# Patient Record
Sex: Female | Born: 1970 | Race: White | Hispanic: No | Marital: Single | Smoking: Current every day smoker
Health system: Southern US, Community
[De-identification: ages and names within clinical notes are randomized; demographics above are authoritative.]

## PROBLEM LIST (undated history)

## (undated) DIAGNOSIS — J449 Chronic obstructive pulmonary disease, unspecified: Secondary | ICD-10-CM

## (undated) DIAGNOSIS — J45909 Unspecified asthma, uncomplicated: Secondary | ICD-10-CM

## (undated) DIAGNOSIS — I10 Essential (primary) hypertension: Secondary | ICD-10-CM

## (undated) HISTORY — PX: ASD REPAIR: SHX258

## (undated) HISTORY — PX: OVARIAN CYST REMOVAL: SHX89

## (undated) HISTORY — PX: CHOLECYSTECTOMY: SHX55

---

## 2005-04-13 ENCOUNTER — Emergency Department: Payer: Self-pay | Admitting: Emergency Medicine

## 2005-12-08 ENCOUNTER — Emergency Department: Payer: Self-pay | Admitting: Emergency Medicine

## 2006-08-03 ENCOUNTER — Emergency Department: Payer: Self-pay | Admitting: General Practice

## 2007-02-19 ENCOUNTER — Emergency Department: Payer: Self-pay | Admitting: Internal Medicine

## 2008-03-13 ENCOUNTER — Emergency Department: Payer: Self-pay | Admitting: Emergency Medicine

## 2009-04-05 ENCOUNTER — Emergency Department: Payer: Self-pay | Admitting: Emergency Medicine

## 2009-07-11 ENCOUNTER — Emergency Department: Payer: Self-pay | Admitting: Emergency Medicine

## 2010-08-03 ENCOUNTER — Emergency Department: Payer: Self-pay | Admitting: Internal Medicine

## 2011-05-02 ENCOUNTER — Emergency Department: Payer: Self-pay | Admitting: Emergency Medicine

## 2012-08-02 ENCOUNTER — Emergency Department: Payer: Self-pay | Admitting: Unknown Physician Specialty

## 2013-04-10 ENCOUNTER — Emergency Department: Payer: Self-pay | Admitting: Emergency Medicine

## 2013-05-15 ENCOUNTER — Emergency Department: Payer: Self-pay | Admitting: Emergency Medicine

## 2014-05-29 ENCOUNTER — Emergency Department: Payer: Self-pay | Admitting: Student

## 2015-09-13 ENCOUNTER — Emergency Department
Admission: EM | Admit: 2015-09-13 | Discharge: 2015-09-13 | Disposition: A | Discharge status: Home / Self Care | Payer: Self-pay | Attending: Emergency Medicine | Admitting: Emergency Medicine

## 2015-09-13 ENCOUNTER — Encounter: Payer: Self-pay | Admitting: Emergency Medicine

## 2015-09-13 DIAGNOSIS — K529 Noninfective gastroenteritis and colitis, unspecified: Secondary | ICD-10-CM | POA: Insufficient documentation

## 2015-09-13 DIAGNOSIS — F17210 Nicotine dependence, cigarettes, uncomplicated: Secondary | ICD-10-CM | POA: Insufficient documentation

## 2015-09-13 DIAGNOSIS — N3001 Acute cystitis with hematuria: Secondary | ICD-10-CM | POA: Insufficient documentation

## 2015-09-13 HISTORY — DX: Chronic obstructive pulmonary disease, unspecified: J44.9

## 2015-09-13 HISTORY — DX: Unspecified asthma, uncomplicated: J45.909

## 2015-09-13 LAB — CBC WITH DIFFERENTIAL/PLATELET
BASOS PCT: 0 %
Basophils Absolute: 0 10*3/uL (ref 0–0.1)
EOS ABS: 0.1 10*3/uL (ref 0–0.7)
Eosinophils Relative: 0 %
HCT: 49.9 % — ABNORMAL HIGH (ref 35.0–47.0)
HEMOGLOBIN: 17.1 g/dL — AB (ref 12.0–16.0)
LYMPHS ABS: 0.4 10*3/uL — AB (ref 1.0–3.6)
Lymphocytes Relative: 3 %
MCH: 29.9 pg (ref 26.0–34.0)
MCHC: 34.3 g/dL (ref 32.0–36.0)
MCV: 87.3 fL (ref 80.0–100.0)
Monocytes Absolute: 0.5 10*3/uL (ref 0.2–0.9)
Monocytes Relative: 3 %
NEUTROS PCT: 94 %
Neutro Abs: 15 10*3/uL — ABNORMAL HIGH (ref 1.4–6.5)
Platelets: 265 10*3/uL (ref 150–440)
RBC: 5.71 MIL/uL — AB (ref 3.80–5.20)
RDW: 13.2 % (ref 11.5–14.5)
WBC: 16 10*3/uL — AB (ref 3.6–11.0)

## 2015-09-13 LAB — URINALYSIS COMPLETE WITH MICROSCOPIC (ARMC ONLY)
BILIRUBIN URINE: NEGATIVE
Glucose, UA: NEGATIVE mg/dL
NITRITE: NEGATIVE
PH: 6 (ref 5.0–8.0)
Protein, ur: 100 mg/dL — AB
Specific Gravity, Urine: 1.027 (ref 1.005–1.030)

## 2015-09-13 LAB — BASIC METABOLIC PANEL
Anion gap: 12 (ref 5–15)
BUN: 17 mg/dL (ref 6–20)
CHLORIDE: 104 mmol/L (ref 101–111)
CO2: 22 mmol/L (ref 22–32)
Calcium: 9.3 mg/dL (ref 8.9–10.3)
Creatinine, Ser: 1.2 mg/dL — ABNORMAL HIGH (ref 0.44–1.00)
GFR calc non Af Amer: 55 mL/min — ABNORMAL LOW (ref >60)
Glucose, Bld: 173 mg/dL — ABNORMAL HIGH (ref 65–99)
POTASSIUM: 3.4 mmol/L — AB (ref 3.5–5.1)
SODIUM: 138 mmol/L (ref 135–145)

## 2015-09-13 MED ORDER — SODIUM CHLORIDE 0.9 % IV BOLUS (SEPSIS)
1000.0000 mL | Freq: Once | INTRAVENOUS | Status: AC
  Administered 2015-09-13: 1000 mL via INTRAVENOUS

## 2015-09-13 MED ORDER — ONDANSETRON HCL 4 MG/2ML IJ SOLN
4.0000 mg | Freq: Once | INTRAMUSCULAR | Status: AC
  Administered 2015-09-13: 4 mg via INTRAVENOUS
  Filled 2015-09-13: qty 2

## 2015-09-13 MED ORDER — PROMETHAZINE HCL 25 MG PO TABS: 25.0000 mg | ORAL_TABLET | Freq: Four times a day (QID) | ORAL | Status: DC | PRN

## 2015-09-13 MED ORDER — FLUCONAZOLE 150 MG PO TABS: 150.0000 mg | ORAL_TABLET | Freq: Every day | ORAL | Status: DC

## 2015-09-13 MED ORDER — SULFAMETHOXAZOLE-TRIMETHOPRIM 800-160 MG PO TABS: 1.0000 | ORAL_TABLET | Freq: Two times a day (BID) | ORAL | Status: DC

## 2015-09-13 NOTE — ED Notes (Signed)
N,V,D that began this am.

## 2015-09-13 NOTE — ED Notes (Signed)
Patient c/o N/V/D since 5:30 this morning, states that she feels weak and lightheaded. Also c/o cramping in her legs.

## 2015-09-13 NOTE — ED Notes (Signed)
Patient states that she lasted vomited 1 hour ago, she has been able to keep gatorade down since that time. Patient denies any sick contacts.

## 2015-09-13 NOTE — ED Notes (Signed)
Pt states she has been able to keep Gatorade down.

## 2015-09-13 NOTE — ED Provider Notes (Signed)
[facility] Emergency Department Provider Note  ____________________________________________  Time seen: Approximately 10:46 AM  I have reviewed the triage vital signs and the nursing notes.   HISTORY  Chief Complaint Emesis and Diarrhea    HPI Lisa Sexton is a 45 y.o. female presents for evaluation of nausea vomiting diarrhea since 5:30 this morning which is approximately 5 hours ago. Patient states she is unable to keep anything down. Denies any fever chills denies any sick contacts. Her past medical history is significant only for asthma and COPD. She describes her symptoms as a 7/10 with no relief with clear liquids and Gatorade at this time. Worse with trying to eat. Or sit up.   Past Medical History  Diagnosis Date  . COPD (chronic obstructive pulmonary disease) (HCC)   . Asthma     There are no active problems to display for this patient.   History reviewed. No pertinent past surgical history.  Current Outpatient Rx  Name  Route  Sig  Dispense  Refill  . promethazine (PHENERGAN) 25 MG tablet   Oral   Take 1 tablet (25 mg total) by mouth every 6 (six) hours as needed for nausea or vomiting.   12 tablet   0   . sulfamethoxazole-trimethoprim (BACTRIM DS,SEPTRA DS) 800-160 MG tablet   Oral   Take 1 tablet by mouth 2 (two) times daily.   14 tablet   0     Allergies Review of patient's allergies indicates no known allergies.  No family history on file.  Social History Social History  Substance Use Topics  . Smoking status: Current Every Day Smoker -- 1.00 packs/day    Types: Cigarettes  . Smokeless tobacco: None  . Alcohol Use: Yes     Comment: rarely    Review of Systems Constitutional: No fever/chills Eyes: No visual changes. ENT: No sore throat. Cardiovascular: Denies chest pain. Respiratory: Denies shortness of breath. Gastrointestinal: Mild abdominal cramping with nausea, vomiting, diarrhea.. Genitourinary:  Negative for dysuria. Musculoskeletal: Negative for back pain. Skin: Negative for rash. Neurological: Negative for headaches, focal weakness or numbness.  10-point ROS otherwise negative.  ____________________________________________   PHYSICAL EXAM:  VITAL SIGNS: ED Triage Vitals  Enc Vitals Group     BP 09/13/15 1025 111/68 mmHg     Pulse Rate 09/13/15 1025 93     Resp 09/13/15 1025 18     Temp 09/13/15 1025 98.2 F (36.8 C)     Temp Source 09/13/15 1025 Oral     SpO2 09/13/15 1025 98 %     Weight 09/13/15 1025 210 lb (95.255 kg)     Height 09/13/15 1025  (1.549 m)     Head Cir --      Peak Flow --      Pain Score 09/13/15 1026 7     Pain Loc --      Pain Edu? --      Excl. in GC? --     Constitutional: Alert and oriented. Sick appearing lying on the side holding her stomach Head: Atraumatic. Nose: No congestion/rhinnorhea. Mouth/Throat: Mucous membranes are moist.  Oropharynx non-erythematous. Neck: No stridor.   Cardiovascular: Normal rate, regular rhythm. Grossly normal heart sounds.  Good peripheral circulation. Respiratory: Normal respiratory effort.  No retractions. Lungs CTAB. Gastrointestinal: Soft and tender. No distention. No abdominal bruits. No CVA tenderness. Musculoskeletal: No lower extremity tenderness nor edema.  No joint effusions. Neurologic:  Normal speech and language. No gross focal neurologic deficits are  appreciated. No gait instability. Skin:  Skin is warm, dry and intact. No rash noted. Psychiatric: Mood and affect are normal. Speech and behavior are normal.  ____________________________________________   LABS (all labs ordered are listed, but only abnormal results are displayed)  Labs Reviewed  CBC WITH DIFFERENTIAL/PLATELET - Abnormal; Notable for the following:    WBC 16.0 (*)    RBC 5.71 (*)    Hemoglobin 17.1 (*)    HCT 49.9 (*)    Neutro Abs 15.0 (*)    Lymphs Abs 0.4 (*)    All other components within normal limits   BASIC METABOLIC PANEL - Abnormal; Notable for the following:    Potassium 3.4 (*)    Glucose, Bld 173 (*)    Creatinine, Ser 1.20 (*)    GFR calc non Af Amer 55 (*)    All other components within normal limits  URINALYSIS COMPLETEWITH MICROSCOPIC ([facility] ONLY) - Abnormal; Notable for the following:    Color, Urine AMBER (*)    APPearance HAZY (*)    Ketones, ur TRACE (*)    Hgb urine dipstick 1+ (*)    Protein, ur 100 (*)    Leukocytes, UA TRACE (*)    Bacteria, UA RARE (*)    Squamous Epithelial / LPF 0-5 (*)    All other components within normal limits     PROCEDURES  Procedure(s) performed: None  Critical Care performed: No  ____________________________________________   INITIAL IMPRESSION / ASSESSMENT AND PLAN / ED COURSE  Pertinent labs & imaging results that were available during my care of the patient were reviewed by me and considered in my medical decision making (see chart for details).  Acute gastroenteritis. Plan is to replace some fluids and draw some labs to make sure the patient is hemodynamically stable prior to discharge and given Zofran 4 mg IV. Patient was given 2 L of IV fluids feeling significantly better at discharge. Still slightly nauseated.  Acute urinary tract infection. Patient was slightly dehydrated provided all lab results to the patient and her husband Rx given for Phenergan 25 mg every 6 hours as needed for nausea and vomiting clear liquid diet for the next 12-24 hours started patient on Bactrim DS twice a day for 7 days work excuse 2 days given. Patient to follow up with PCP or return to the ER with any worsening symptomology. ____________________________________________   FINAL CLINICAL IMPRESSION(S) / ED DIAGNOSES  Final diagnoses:  Acute cystitis with hematuria  Gastroenteritis, acute     This chart was dictated using voice recognition software/Dragon. Despite best efforts to proofread, errors can occur which can change the meaning.  Any change was purely unintentional.   Evangeline Dakinharles M Beers, PA-C 09/13/15 1315  Governor Rooksebecca Lord, MD 09/13/15 1350

## 2016-08-03 ENCOUNTER — Emergency Department
Admission: EM | Admit: 2016-08-03 | Discharge: 2016-08-03 | Disposition: A | Discharge status: Home / Self Care | Payer: Self-pay | Attending: Emergency Medicine | Admitting: Emergency Medicine

## 2016-08-03 DIAGNOSIS — J449 Chronic obstructive pulmonary disease, unspecified: Secondary | ICD-10-CM | POA: Insufficient documentation

## 2016-08-03 DIAGNOSIS — J45909 Unspecified asthma, uncomplicated: Secondary | ICD-10-CM | POA: Insufficient documentation

## 2016-08-03 DIAGNOSIS — F17210 Nicotine dependence, cigarettes, uncomplicated: Secondary | ICD-10-CM | POA: Insufficient documentation

## 2016-08-03 DIAGNOSIS — K120 Recurrent oral aphthae: Secondary | ICD-10-CM | POA: Insufficient documentation

## 2016-08-03 MED ORDER — TRIAMCINOLONE ACETONIDE 0.1 % MT PSTE: 1.0000 "application " | PASTE | Freq: Two times a day (BID) | OROMUCOSAL | 0 refills | Status: DC

## 2016-08-03 NOTE — ED Triage Notes (Signed)
Pt went to donate plasma on Jan 9th and they told her that she had white blemishes under tongue and that she needed to have them checked out

## 2016-08-03 NOTE — ED Provider Notes (Signed)
[facility] Emergency Department Provider Note  ____________________________________________   First MD Initiated Contact with Patient 08/03/16 1420     (approximate)  I have reviewed the triage vital signs and the nursing notes.   HISTORY  Chief Complaint Mouth Lesions   HPI Lisa Sexton is a 46 y.o. female is here with complaint of right limitations on her tongue that was seen at the plasma donation center on January 9. Patient states she went there today and a plasma and was turned down because of this. She is now here to have the areas checked out. She states she never saw the right areas that the provider there pointed out to her. She states the only time she is having now is a ulcerative lesion on the inner lower lip. She is also here to have papers filled out so that she can now donate plasma.   Past Medical History:  Diagnosis Date  . Asthma   . COPD (chronic obstructive pulmonary disease) (HCC)     There are no active problems to display for this patient.   History reviewed. No pertinent surgical history.  Prior to Admission medications   Medication Sig Start Date End Date Taking? Authorizing Provider  fluconazole (DIFLUCAN) 150 MG tablet Take 1 tablet (150 mg total) by mouth daily. 09/13/15   Evangeline Dakin, PA-C  promethazine (PHENERGAN) 25 MG tablet Take 1 tablet (25 mg total) by mouth every 6 (six) hours as needed for nausea or vomiting. 09/13/15   Charmayne Sheer Beers, PA-C  sulfamethoxazole-trimethoprim (BACTRIM DS,SEPTRA DS) 800-160 MG tablet Take 1 tablet by mouth 2 (two) times daily. 09/13/15   Charmayne Sheer Beers, PA-C  triamcinolone (KENALOG) 0.1 % paste Use as directed 1 application in the mouth or throat 2 (two) times daily. 08/03/16   Tommi Rumps, PA-C    Allergies Patient has no known allergies.  No family history on file.  Social History Social History  Substance Use Topics  . Smoking status: Current Every Day Smoker   Packs/day: 1.00    Types: Cigarettes  . Smokeless tobacco: Never Used  . Alcohol use Yes     Comment: rarely    Review of Systems Constitutional: No fever/chills ENT: No sore throat.Positive ulcerative lesion to the mouth. Cardiovascular: Denies chest pain. Respiratory: Denies shortness of breath. Gastrointestinal:  No nausea, no vomiting. Musculoskeletal: Negative for back pain. Skin: As above. Neurological: Negative for headaches, focal weakness or numbness.  10-point ROS otherwise negative.  ____________________________________________   PHYSICAL EXAM:  VITAL SIGNS: ED Triage Vitals [08/03/16 1410]  Enc Vitals Group     BP (!) 151/84     Pulse Rate 74     Resp 16     Temp 97.8 F (36.6 C)     Temp Source Oral     SpO2 99 %     Weight 206 lb (93.4 kg)     Height 5\' 1"  (1.549 m)     Head Circumference      Peak Flow      Pain Score 0     Pain Loc      Pain Edu?      Excl. in GC?     Constitutional: Alert and oriented. Well appearing and in no acute distress. Eyes: Conjunctivae are normal. PERRL. EOMI. Head: Atraumatic. Nose: No congestion/rhinnorhea. Mouth/Throat: Mucous membranes are moist.  Oropharynx non-erythematous. Superficial 1 cm ulcerative lesion on the inner lower right lip. No drainage noted. Neck: No stridor.  Hematological/Lymphatic/Immunilogical: No cervical lymphadenopathy. Cardiovascular: Normal rate, regular rhythm. Grossly normal heart sounds.  Good peripheral circulation. Respiratory: Normal respiratory effort.  No retractions. Lungs CTAB. Musculoskeletal: His upper and lower extremities no difficulty. Normal gait was noted. Neurologic:  Normal speech and language. No gross focal neurologic deficits are appreciated. No gait instability. Skin:  Skin is warm, dry and intact. No rash noted. Psychiatric: Mood and affect are normal. Speech and behavior are normal.  ____________________________________________   LABS (all labs ordered are  listed, but only abnormal results are displayed)  Labs Reviewed - No data to display  PROCEDURES  Procedure(s) performed: None  Procedures  Critical Care performed: No  ____________________________________________   INITIAL IMPRESSION / ASSESSMENT AND PLAN / ED COURSE  Pertinent labs & imaging results that were available during my care of the patient were reviewed by me and considered in my medical decision making (see chart for details).  Initial was given a prescription for Kenalog and Orabase to apply to her aphthous ulcer. Patient was told that papers to donate plasma would not be filled out. She was given a list of clinics that she could go to that charge per sliding scale including [facility].    ___________________________________________   FINAL CLINICAL IMPRESSION(S) / ED DIAGNOSES  Final diagnoses:  Ulcer aphthous oral      NEW MEDICATIONS STARTED DURING THIS VISIT:  Discharge Medication List as of 08/03/2016  2:47 PM    START taking these medications   Details  triamcinolone (KENALOG) 0.1 % paste Use as directed 1 application in the mouth or throat 2 (two) times daily., Starting Mon 08/03/2016, Print         Note:  This document was prepared using Dragon voice recognition software and may include unintentional dictation errors.    Tommi RumpsRhonda L Fiorella Hanahan, PA-C 08/03/16 1823    Jene Everyobert Kinner, MD 08/04/16 [phone]

## 2016-08-03 NOTE — Discharge Instructions (Addendum)
[facility] or one of the clinics listed on your papers for a primary care doctor. Use Kenalog in Orabase twice a day to the lip and avoid juices with high acid content.

## 2016-09-18 ENCOUNTER — Emergency Department: Payer: Self-pay

## 2016-09-18 ENCOUNTER — Encounter: Payer: Self-pay | Admitting: Emergency Medicine

## 2016-09-18 ENCOUNTER — Emergency Department
Admission: EM | Admit: 2016-09-18 | Discharge: 2016-09-18 | Disposition: A | Discharge status: Home / Self Care | Payer: Self-pay | Attending: Emergency Medicine | Admitting: Emergency Medicine

## 2016-09-18 DIAGNOSIS — R05 Cough: Secondary | ICD-10-CM

## 2016-09-18 DIAGNOSIS — F17210 Nicotine dependence, cigarettes, uncomplicated: Secondary | ICD-10-CM | POA: Insufficient documentation

## 2016-09-18 DIAGNOSIS — R0989 Other specified symptoms and signs involving the circulatory and respiratory systems: Secondary | ICD-10-CM | POA: Insufficient documentation

## 2016-09-18 DIAGNOSIS — J449 Chronic obstructive pulmonary disease, unspecified: Secondary | ICD-10-CM | POA: Insufficient documentation

## 2016-09-18 DIAGNOSIS — Z79899 Other long term (current) drug therapy: Secondary | ICD-10-CM | POA: Insufficient documentation

## 2016-09-18 DIAGNOSIS — J45909 Unspecified asthma, uncomplicated: Secondary | ICD-10-CM | POA: Insufficient documentation

## 2016-09-18 DIAGNOSIS — R058 Other specified cough: Secondary | ICD-10-CM

## 2016-09-18 MED ORDER — AZITHROMYCIN 250 MG PO TABS: ORAL_TABLET | ORAL | 0 refills | Status: DC

## 2016-09-18 MED ORDER — LEVOFLOXACIN 500 MG PO TABS: 500.0000 mg | ORAL_TABLET | Freq: Every day | ORAL | 0 refills | Status: AC

## 2016-09-18 MED ORDER — PREDNISONE 10 MG (21) PO TBPK: ORAL_TABLET | ORAL | 0 refills | Status: DC

## 2016-09-18 NOTE — ED Triage Notes (Signed)
Pt to ed with c/o congestion, cough x several weeks.

## 2016-09-18 NOTE — ED Provider Notes (Signed)
[facility] Emergency Department Provider Note  ____________________________________________  Time seen: Approximately 11:34 AM  I have reviewed the triage vital signs and the nursing notes.   HISTORY  Chief Complaint Nasal Congestion    HPI Lisa Sexton is a 46 y.o. female that presents to the emergency department with cough and congestion for several weeks. Patient states that she is sneezing green sputum and blood and coughing up green sputum. Patient states that she has a lot of pressure in her cheeks. Patient states that she was diagnosed with influenza at the end of January. Patient recovered but then congestion and cough returned. Patient started to feel better and then 4 days ago began to feel worse again. Patient has been taking cough syrup for symptoms. Patient is using albuterol inhaler but has not used her nebulizer. Patient has history of asthma and COPD. Patient is trying to quit smoking and is currently smoking 4-5 cigarettes per day. She states that she came to the ER because she knew that she would not be able to afford both the medication and the clinic visit. Patient sees a pulmonology doctor at [facility]. Patient denies fever, shortness of breath, chest pain, nausea, vomiting, abdominal pain.    Past Medical History:  Diagnosis Date  . Asthma   . COPD (chronic obstructive pulmonary disease) (HCC)     There are no active problems to display for this patient.   History reviewed. No pertinent surgical history.  Prior to Admission medications   Medication Sig Start Date End Date Taking? Authorizing Provider  fluconazole (DIFLUCAN) 150 MG tablet Take 1 tablet (150 mg total) by mouth daily. 09/13/15   Charmayne Sheer Beers, PA-C  levofloxacin (LEVAQUIN) 500 MG tablet Take 1 tablet (500 mg total) by mouth daily. 09/18/16 09/28/16  Enid Derry, PA-C  predniSONE (STERAPRED UNI-PAK 21 TAB) 10 MG (21) TBPK tablet Take 6 tablets on day 1, take 5 tablets on day  2, take 4 tablets on day 3, take 3 tablets on day 4, take 2 tablets on day 5, take 1 tablet on day 6 09/18/16   Enid Derry, PA-C  promethazine (PHENERGAN) 25 MG tablet Take 1 tablet (25 mg total) by mouth every 6 (six) hours as needed for nausea or vomiting. 09/13/15   Charmayne Sheer Beers, PA-C  sulfamethoxazole-trimethoprim (BACTRIM DS,SEPTRA DS) 800-160 MG tablet Take 1 tablet by mouth 2 (two) times daily. 09/13/15   Charmayne Sheer Beers, PA-C  triamcinolone (KENALOG) 0.1 % paste Use as directed 1 application in the mouth or throat 2 (two) times daily. 08/03/16   Tommi Rumps, PA-C    Allergies Patient has no known allergies.  History reviewed. No pertinent family history.  Social History Social History  Substance Use Topics  . Smoking status: Current Every Day Smoker    Packs/day: 1.00    Types: Cigarettes  . Smokeless tobacco: Never Used  . Alcohol use Yes     Comment: rarely     Review of Systems  Constitutional: No fever/chills Eyes: No visual changes. No discharge. ENT: Positive for congestion and rhinorrhea. Cardiovascular: No chest pain. Respiratory: Positive for cough. No SOB. Gastrointestinal: No abdominal pain.  No nausea, no vomiting.  No diarrhea.  No constipation. Musculoskeletal: Negative for musculoskeletal pain. Skin: Negative for rash, abrasions, lacerations, ecchymosis. Neurological: Negative for headaches.   ____________________________________________   PHYSICAL EXAM:  VITAL SIGNS: ED Triage Vitals  Enc Vitals Group     BP 09/18/16 1111 (!) 170/91  Pulse Rate 09/18/16 1111 88     Resp 09/18/16 1111 20     Temp 09/18/16 1111 97.8 F (36.6 C)     Temp Source 09/18/16 1111 Oral     SpO2 09/18/16 1111 98 %     Weight 09/18/16 1108 206 lb (93.4 kg)     Height --      Head Circumference --      Peak Flow --      Pain Score 09/18/16 1108 4     Pain Loc --      Pain Edu? --      Excl. in GC? --      Constitutional: Alert and oriented. Well  appearing and in no acute distress. Eyes: Conjunctivae are normal. PERRL. EOMI. No discharge. Head: Atraumatic. ENT: Frontal and maxillary sinus tenderness.      Ears: Tympanic membranes pearly gray with good landmarks. No discharge.      Nose: Mild congestion/rhinnorhea.      Mouth/Throat: Mucous membranes are moist. Oropharynx non-erythematous. Tonsils not enlarged. No exudates. Uvula midline. Neck: No stridor.   Hematological/Lymphatic/Immunilogical: No cervical lymphadenopathy. Cardiovascular: Normal rate, regular rhythm.  Good peripheral circulation. Respiratory: Normal respiratory effort without tachypnea or retractions. Scattered wheezes on auscultation. Good air entry to the bases with no decreased or absent breath sounds. Gastrointestinal: Bowel sounds 4 quadrants. Soft and nontender to palpation. No guarding or rigidity. No palpable masses. No distention. Musculoskeletal: Full range of motion to all extremities. No gross deformities appreciated. Neurologic:  Normal speech and language. No gross focal neurologic deficits are appreciated.  Skin:  Skin is warm, dry and intact. No rash noted.   ____________________________________________   LABS (all labs ordered are listed, but only abnormal results are displayed)  Labs Reviewed - No data to display ____________________________________________  EKG   ____________________________________________  RADIOLOGY Lexine Baton, personally viewed and evaluated these images (plain radiographs) as part of my medical decision making, as well as reviewing the written report by the radiologist.  Dg Chest 2 View  Result Date: 09/18/2016 CLINICAL DATA:  Cough for several weeks, smoker EXAM: CHEST  2 VIEW COMPARISON:  04/10/2013 chest radiograph. FINDINGS: Stable cardiomediastinal silhouette with normal heart size. No pneumothorax. No pleural effusion. There is faint hazy opacity throughout both lungs. No consolidative airspace disease.  IMPRESSION: Nonspecific faint hazy opacity throughout both lungs without consolidative airspace disease. Differential include smoking related interstitial lung disease in this reported current smoker. Other atypical inflammatory etiologies are not excluded. Correlation with high-resolution chest CT could be obtained as clinically warranted. Electronically Signed   By: Delbert Phenix M.D.   On: 09/18/2016 11:46    ____________________________________________    PROCEDURES  Procedure(s) performed:    Procedures    Medications - No data to display   ____________________________________________   INITIAL IMPRESSION / ASSESSMENT AND PLAN / ED COURSE  Pertinent labs & imaging results that were available during my care of the patient were reviewed by me and considered in my medical decision making (see chart for details).  Review of the [place] CSRS was performed in accordance of the [facility] prior to dispensing any controlled drugs.   Patient's diagnosis is consistent with respiratory tract congestion with cough. Vital signs and exam are reassuring. No acute cardiopulmonary processes indicated on chest x-ray. All findings on chest x-ray were disclosed to patient. Patient is going to follow up with her pulmonology doctor. Patient appears well and is staying well hydrated.  Patient feels comfortable going home. Patient  will be discharged home with prescriptions for Levaquin and prednisone. Patient is to follow up with pulmonary doctor as needed or otherwise directed. Patient is given ED precautions to return to the ED for any worsening or new symptoms.     ____________________________________________  FINAL CLINICAL IMPRESSION(S) / ED DIAGNOSES  Final diagnoses:  Respiratory tract congestion with cough      NEW MEDICATIONS STARTED DURING THIS VISIT:  Discharge Medication List as of 09/18/2016 12:12 PM    START taking these medications   Details  levofloxacin (LEVAQUIN) 500 MG tablet Take  1 tablet (500 mg total) by mouth daily., Starting Fri 09/18/2016, Until Mon 09/28/2016, Print            This chart was dictated using voice recognition software/Dragon. Despite best efforts to proofread, errors can occur which can change the meaning. Any change was purely unintentional.    Enid DerryAshley Susette Seminara, PA-C 09/18/16 1728    Jene Everyobert Kinner, MD 09/22/16 [phone]

## 2017-09-16 ENCOUNTER — Emergency Department
Admission: EM | Admit: 2017-09-16 | Discharge: 2017-09-16 | Disposition: A | Discharge status: Home / Self Care | Payer: Self-pay | Attending: Emergency Medicine | Admitting: Emergency Medicine

## 2017-09-16 ENCOUNTER — Other Ambulatory Visit: Payer: Self-pay

## 2017-09-16 DIAGNOSIS — J449 Chronic obstructive pulmonary disease, unspecified: Secondary | ICD-10-CM | POA: Insufficient documentation

## 2017-09-16 DIAGNOSIS — J0100 Acute maxillary sinusitis, unspecified: Secondary | ICD-10-CM | POA: Insufficient documentation

## 2017-09-16 DIAGNOSIS — F17210 Nicotine dependence, cigarettes, uncomplicated: Secondary | ICD-10-CM | POA: Insufficient documentation

## 2017-09-16 MED ORDER — AMOXICILLIN-POT CLAVULANATE 875-125 MG PO TABS: 1.0000 | ORAL_TABLET | Freq: Two times a day (BID) | ORAL | 0 refills | Status: AC

## 2017-09-16 NOTE — ED Provider Notes (Signed)
[facility] Emergency Department Provider Note  ____________________________________________  Time seen: Approximately 7:19 PM  I have reviewed the triage vital signs and the nursing notes.   HISTORY  Chief Complaint Fever and Nasal Congestion    HPI Lisa Sexton is a 47 y.o. female presents to the emergency department with maxillary and frontal sinus tenderness, purulent rhinorrhea, headache, malaise and fever.  Patient reports that she experiences sinusitis 1-2 times a year.  She denies emesis or diarrhea.  No alleviating measures have been attempted.  Patient is also requesting a work note.   Past Medical History:  Diagnosis Date  . Asthma   . COPD (chronic obstructive pulmonary disease) (HCC)     There are no active problems to display for this patient.   No past surgical history on file.  Prior to Admission medications   Medication Sig Start Date End Date Taking? Authorizing Provider  amoxicillin-clavulanate (AUGMENTIN) 875-125 MG tablet Take 1 tablet by mouth 2 (two) times daily for 10 days. 09/16/17 09/26/17  Orvil Feil, PA-C  fluconazole (DIFLUCAN) 150 MG tablet Take 1 tablet (150 mg total) by mouth daily. 09/13/15   Beers, Charmayne Sheer, PA-C  predniSONE (STERAPRED UNI-PAK 21 TAB) 10 MG (21) TBPK tablet Take 6 tablets on day 1, take 5 tablets on day 2, take 4 tablets on day 3, take 3 tablets on day 4, take 2 tablets on day 5, take 1 tablet on day 6 09/18/16   Enid Derry, PA-C  promethazine (PHENERGAN) 25 MG tablet Take 1 tablet (25 mg total) by mouth every 6 (six) hours as needed for nausea or vomiting. 09/13/15   Beers, Charmayne Sheer, PA-C  sulfamethoxazole-trimethoprim (BACTRIM DS,SEPTRA DS) 800-160 MG tablet Take 1 tablet by mouth 2 (two) times daily. 09/13/15   Beers, Charmayne Sheer, PA-C  triamcinolone (KENALOG) 0.1 % paste Use as directed 1 application in the mouth or throat 2 (two) times daily. 08/03/16   Tommi Rumps, PA-C     Allergies Patient has no known allergies.  No family history on file.  Social History Social History   Tobacco Use  . Smoking status: Current Every Day Smoker    Packs/day: 1.00    Types: Cigarettes  . Smokeless tobacco: Never Used  Substance Use Topics  . Alcohol use: Yes    Comment: rarely  . Drug use: Not on file     Review of Systems  Constitutional: Patient has fever.  Eyes: No visual changes. No discharge ENT: Patient has purulent rhinorrhea. Cardiovascular: no chest pain. Respiratory: no cough. No SOB. Musculoskeletal: Negative for musculoskeletal pain. Skin: Negative for rash, abrasions, lacerations, ecchymosis.   ____________________________________________   PHYSICAL EXAM:  VITAL SIGNS: ED Triage Vitals [09/16/17 1902]  Enc Vitals Group     BP (!) 176/91     Pulse Rate 93     Resp 20     Temp 98.1 F (36.7 C)     Temp Source Oral     SpO2 98 %     Weight 190 lb (86.2 kg)     Height 5\' 1"  (1.549 m)     Head Circumference      Peak Flow      Pain Score 7     Pain Loc      Pain Edu?      Excl. in GC?      Constitutional: Alert and oriented. Well appearing and in no acute distress. Eyes: Conjunctivae are normal. PERRL. EOMI. Head: Atraumatic.  Patient has maxillary and frontal sinus tenderness. ENT:      Ears: TMs are pearly.      Nose: No congestion/rhinnorhea.      Mouth/Throat: Mucous membranes are moist.  Neck: No stridor.  No cervical spine tenderness to palpation. Cardiovascular: Normal rate, regular rhythm. Normal S1 and S2.  Good peripheral circulation. Respiratory: Normal respiratory effort without tachypnea or retractions. Lungs CTAB. Good air entry to the bases with no decreased or absent breath sounds. Musculoskeletal: Full range of motion to all extremities. No gross deformities appreciated. Neurologic:  Normal speech and language. No gross focal neurologic deficits are appreciated.  Skin:  Skin is warm, dry and intact. No  rash noted.   ____________________________________________   LABS (all labs ordered are listed, but only abnormal results are displayed)  Labs Reviewed - No data to display ____________________________________________  EKG   ____________________________________________  RADIOLOGY   No results found.  ____________________________________________    PROCEDURES  Procedure(s) performed:    Procedures    Medications - No data to display   ____________________________________________   INITIAL IMPRESSION / ASSESSMENT AND PLAN / ED COURSE  Pertinent labs & imaging results that were available during my care of the patient were reviewed by me and considered in my medical decision making (see chart for details).  Review of the [place] CSRS was performed in accordance of the [facility] prior to dispensing any controlled drugs.     Assessment and plan Sinusitis Patient presents to the emergency department with maxillary and frontal sinus tenderness along with purulent rhinorrhea.  Differential diagnosis included acute upper respiratory tract infection versus sinusitis.  History and physical exam findings are consistent with sinusitis at this time.  Patient was treated empirically with Augmentin.  She was advised to follow-up with primary care as needed.  Patient was also advised to follow-up regarding hypertension noted at triage.  All patient questions were answered.    ____________________________________________  FINAL CLINICAL IMPRESSION(S) / ED DIAGNOSES  Final diagnoses:  Acute non-recurrent maxillary sinusitis      NEW MEDICATIONS STARTED DURING THIS VISIT:  ED Discharge Orders        Ordered    amoxicillin-clavulanate (AUGMENTIN) 875-125 MG tablet  2 times daily     09/16/17 1916          This chart was dictated using voice recognition software/Dragon. Despite best efforts to proofread, errors can occur which can change the meaning. Any change was purely  unintentional.    Gasper LloydWoods, Raja Liska M, PA-C 09/16/17 Penni [id number]    Paduchowski, Kevin, MD 09/16/17 2222

## 2017-09-16 NOTE — ED Triage Notes (Signed)
Pt in with co fever, sinus pressure, congestion, and headache x 4 days. Pt states hx of sinus infections states feels the same.

## 2017-11-07 ENCOUNTER — Emergency Department: Payer: Self-pay

## 2017-11-07 ENCOUNTER — Other Ambulatory Visit: Payer: Self-pay

## 2017-11-07 ENCOUNTER — Emergency Department
Admission: EM | Admit: 2017-11-07 | Discharge: 2017-11-07 | Disposition: A | Discharge status: Home / Self Care | Payer: Self-pay | Attending: Emergency Medicine | Admitting: Emergency Medicine

## 2017-11-07 DIAGNOSIS — F17210 Nicotine dependence, cigarettes, uncomplicated: Secondary | ICD-10-CM | POA: Insufficient documentation

## 2017-11-07 DIAGNOSIS — J45901 Unspecified asthma with (acute) exacerbation: Secondary | ICD-10-CM | POA: Insufficient documentation

## 2017-11-07 DIAGNOSIS — R0602 Shortness of breath: Secondary | ICD-10-CM

## 2017-11-07 LAB — COMPREHENSIVE METABOLIC PANEL
ALBUMIN: 4.8 g/dL (ref 3.5–5.0)
ALK PHOS: 86 U/L (ref 38–126)
ALT: 19 U/L (ref 14–54)
AST: 25 U/L (ref 15–41)
Anion gap: 12 (ref 5–15)
BILIRUBIN TOTAL: 0.5 mg/dL (ref 0.3–1.2)
BUN: 15 mg/dL (ref 6–20)
CO2: 21 mmol/L — ABNORMAL LOW (ref 22–32)
CREATININE: 0.8 mg/dL (ref 0.44–1.00)
Calcium: 9.6 mg/dL (ref 8.9–10.3)
Chloride: 105 mmol/L (ref 101–111)
GFR calc Af Amer: >60 mL/min (ref >60)
GFR calc non Af Amer: >60 mL/min (ref >60)
GLUCOSE: 144 mg/dL — AB (ref 65–99)
Potassium: 3.2 mmol/L — ABNORMAL LOW (ref 3.5–5.1)
Sodium: 138 mmol/L (ref 135–145)
Total Protein: 7.8 g/dL (ref 6.5–8.1)

## 2017-11-07 LAB — CBC WITH DIFFERENTIAL/PLATELET
BASOS ABS: 0.1 10*3/uL (ref 0–0.1)
Basophils Relative: 1 %
Eosinophils Absolute: 0 10*3/uL (ref 0–0.7)
Eosinophils Relative: 0 %
HEMATOCRIT: 45.7 % (ref 35.0–47.0)
HEMOGLOBIN: 16.1 g/dL — AB (ref 12.0–16.0)
LYMPHS PCT: 18 %
Lymphs Abs: 1.9 10*3/uL (ref 1.0–3.6)
MCH: 30.7 pg (ref 26.0–34.0)
MCHC: 35.3 g/dL (ref 32.0–36.0)
MCV: 86.9 fL (ref 80.0–100.0)
MONO ABS: 0.7 10*3/uL (ref 0.2–0.9)
Monocytes Relative: 7 %
NEUTROS ABS: 7.5 10*3/uL — AB (ref 1.4–6.5)
NEUTROS PCT: 74 %
Platelets: 249 10*3/uL (ref 150–440)
RBC: 5.25 MIL/uL — AB (ref 3.80–5.20)
RDW: 12.9 % (ref 11.5–14.5)
WBC: 10.1 10*3/uL (ref 3.6–11.0)

## 2017-11-07 MED ORDER — LORAZEPAM 0.5 MG PO TABS: 0.5000 mg | ORAL_TABLET | Freq: Three times a day (TID) | ORAL | 0 refills | Status: AC | PRN

## 2017-11-07 MED ORDER — PREDNISONE 20 MG PO TABS: 40.0000 mg | ORAL_TABLET | Freq: Every day | ORAL | 0 refills | Status: DC

## 2017-11-07 MED ORDER — PREDNISONE 20 MG PO TABS
60.0000 mg | ORAL_TABLET | Freq: Once | ORAL | Status: AC
  Administered 2017-11-07: 60 mg via ORAL
  Filled 2017-11-07: qty 3

## 2017-11-07 MED ORDER — ALBUTEROL SULFATE (2.5 MG/3ML) 0.083% IN NEBU
5.0000 mg | INHALATION_SOLUTION | Freq: Once | RESPIRATORY_TRACT | Status: AC
  Administered 2017-11-07: 5 mg via RESPIRATORY_TRACT
  Filled 2017-11-07: qty 6

## 2017-11-07 NOTE — ED Triage Notes (Addendum)
Patient c/o shortness of breath and wheezing for 45 minutes PTA. Patient states she has a history of asthma and COPD. Patient has audible wheezing in triage.Patient is able to speak in complete sentences. Patient states she has a history of panic attacks and has been under stress lately.

## 2017-11-07 NOTE — ED Notes (Signed)
Pt taken to xray, will administer breathing treatment when pt returns.

## 2017-11-07 NOTE — ED Provider Notes (Signed)
[facility] Emergency Department Provider Note  Time seen: 7:04 PM  I have reviewed the triage vital signs and the nursing notes.   HISTORY  Chief Complaint Shortness of Breath    HPI Lisa Sexton is a 47 y.o. female with a past medical history of asthma and COPD presents to the emergency department for shortness of breath.  Going to the patient at 4 PM today she developed shortness of breath with wheeze.  Used her nebulizer treatment at home but felt like it was not helping.  She does admit a history of anxiety with panic attacks in the past but states is been many years since she has had one.  However she states this did feel like a panic attack she felt like the walls were closing in on her and she could not catch her breath, states she has been under a lot of stress and has been working a significant amount of the past several weeks, husband left town this morning for work for 1 month.  Patient states she had a breathing treatment in the emergency department and now feels much better.  Denies any shortness of breath currently.  Denies any chest pain at any point.  Denies any fever, recent illnesses cough or congestion.  Patient does continue to smoke cigarettes although states she is trying to cut back.   Past Medical History:  Diagnosis Date  . Asthma   . COPD (chronic obstructive pulmonary disease) (HCC)     There are no active problems to display for this patient.   No past surgical history on file.  Prior to Admission medications   Medication Sig Start Date End Date Taking? Authorizing Provider  fluconazole (DIFLUCAN) 150 MG tablet Take 1 tablet (150 mg total) by mouth daily. 09/13/15   Beers, Charmayne Sheer, PA-C  predniSONE (STERAPRED UNI-PAK 21 TAB) 10 MG (21) TBPK tablet Take 6 tablets on day 1, take 5 tablets on day 2, take 4 tablets on day 3, take 3 tablets on day 4, take 2 tablets on day 5, take 1 tablet on day 6 09/18/16   Enid Derry, PA-C   promethazine (PHENERGAN) 25 MG tablet Take 1 tablet (25 mg total) by mouth every 6 (six) hours as needed for nausea or vomiting. 09/13/15   Beers, Charmayne Sheer, PA-C  sulfamethoxazole-trimethoprim (BACTRIM DS,SEPTRA DS) 800-160 MG tablet Take 1 tablet by mouth 2 (two) times daily. 09/13/15   Beers, Charmayne Sheer, PA-C  triamcinolone (KENALOG) 0.1 % paste Use as directed 1 application in the mouth or throat 2 (two) times daily. 08/03/16   Tommi Rumps, PA-C    No Known Allergies  No family history on file.  Social History Social History   Tobacco Use  . Smoking status: Current Every Day Smoker    Packs/day: 1.00    Types: Cigarettes  . Smokeless tobacco: Never Used  Substance Use Topics  . Alcohol use: Yes    Comment: rarely  . Drug use: Not on file    Review of Systems Constitutional: Negative for fever. Eyes: Negative for visual complaints ENT: Negative for recent illness/congestion Cardiovascular: Negative for chest pain. Respiratory: Negative for shortness of breath. Gastrointestinal: Negative for abdominal pain, vomiting and diarrhea. Genitourinary: Negative for urinary compaints Musculoskeletal: Negative for musculoskeletal complaints Skin: Negative for skin complaints  Neurological: Negative for headache All other ROS negative  ____________________________________________   PHYSICAL EXAM:  VITAL SIGNS: ED Triage Vitals  Enc Vitals Group     BP  11/07/17 1648 (!) 178/89     Pulse Rate 11/07/17 1648 100     Resp 11/07/17 1648 (!) 24     Temp 11/07/17 1648 98.3 F (36.8 C)     Temp Source 11/07/17 1648 Oral     SpO2 11/07/17 1648 98 %     Weight 11/07/17 1650 180 lb (81.6 kg)     Height 11/07/17 1650  (1.549 m)     Head Circumference --      Peak Flow --      Pain Score 11/07/17 1649 0     Pain Loc --      Pain Edu? --      Excl. in GC? --     Constitutional: Alert and oriented. Well appearing and in no distress. Eyes: Normal exam ENT   Head:  Normocephalic and atraumatic.   Mouth/Throat: Mucous membranes are moist. Cardiovascular: Normal rate, regular rhythm. No murmur Respiratory: Normal respiratory effort without tachypnea nor retractions.  Slight expiratory wheeze.  No rales or rhonchi Gastrointestinal: Soft and nontender. No distention.   Musculoskeletal: Nontender with normal range of motion in all extremities.  Neurologic:  Normal speech and language. No gross focal neurologic deficits  Skin:  Skin is warm, dry and intact.  Psychiatric: Mood and affect are normal. Speech and behavior are normal.   ____________________________________________    EKG  EKG reviewed and interpreted by myself shows sinus tachycardia 101 bpm with a narrow QRS, normal axis, normal intervals with nonspecific but no concerning ST changes  ____________________________________________    RADIOLOGY  Chest x-ray negative  ____________________________________________   INITIAL IMPRESSION / ASSESSMENT AND PLAN / ED COURSE  Pertinent labs & imaging results that were available during my care of the patient were reviewed by me and considered in my medical decision making (see chart for details).  Patient presents emergency department for shortness of breath and wheeze.  Differential would include COPD exacerbation, asthma exacerbation, anxiety, pneumonia, upper respiratory infection.  Overall the patient appears well, initially tachycardic currently 85 bpm during my evaluation with 100% room air saturation during my evaluation.  Patient states he feels much better for breathing treatment very very slight expiratory wheeze on my exam.  Labs largely within normal limits.  Chest x-ray is clear.  I believe the patient likely had an asthma attack/exacerbation possibly leading to panic attack/anxiety as well.  We will place the patient on prednisone, patient will use her nebulizer as needed.  We will also prescribe a very short course of Ativan for the  patient.  I discussed return precautions.  Patient agreeable to this plan of care.  ____________________________________________   FINAL CLINICAL IMPRESSION(S) / ED DIAGNOSES  Dyspnea     Minna Antis, MD 11/07/17 [phone]

## 2018-02-01 ENCOUNTER — Emergency Department
Admission: EM | Admit: 2018-02-01 | Discharge: 2018-02-01 | Disposition: A | Discharge status: Home / Self Care | Payer: Self-pay | Attending: Student in an Organized Health Care Education/Training Program | Admitting: Student in an Organized Health Care Education/Training Program

## 2018-02-01 ENCOUNTER — Other Ambulatory Visit: Payer: Self-pay

## 2018-02-01 ENCOUNTER — Encounter: Payer: Self-pay | Admitting: Emergency Medicine

## 2018-02-01 DIAGNOSIS — Y9289 Other specified places as the place of occurrence of the external cause: Secondary | ICD-10-CM | POA: Insufficient documentation

## 2018-02-01 DIAGNOSIS — Y998 Other external cause status: Secondary | ICD-10-CM | POA: Insufficient documentation

## 2018-02-01 DIAGNOSIS — F17210 Nicotine dependence, cigarettes, uncomplicated: Secondary | ICD-10-CM | POA: Insufficient documentation

## 2018-02-01 DIAGNOSIS — J449 Chronic obstructive pulmonary disease, unspecified: Secondary | ICD-10-CM | POA: Insufficient documentation

## 2018-02-01 DIAGNOSIS — Z23 Encounter for immunization: Secondary | ICD-10-CM | POA: Insufficient documentation

## 2018-02-01 DIAGNOSIS — W268XXA Contact with other sharp object(s), not elsewhere classified, initial encounter: Secondary | ICD-10-CM | POA: Insufficient documentation

## 2018-02-01 DIAGNOSIS — S61213A Laceration without foreign body of left middle finger without damage to nail, initial encounter: Secondary | ICD-10-CM | POA: Insufficient documentation

## 2018-02-01 DIAGNOSIS — Y9389 Activity, other specified: Secondary | ICD-10-CM | POA: Insufficient documentation

## 2018-02-01 MED ORDER — TETANUS-DIPHTH-ACELL PERTUSSIS 5-2.5-18.5 LF-MCG/0.5 IM SUSP
0.5000 mL | Freq: Once | INTRAMUSCULAR | Status: AC
  Administered 2018-02-01: 0.5 mL via INTRAMUSCULAR
  Filled 2018-02-01: qty 0.5

## 2018-02-01 MED ORDER — BACITRACIN ZINC 500 UNIT/GM EX OINT
1.0000 "application " | TOPICAL_OINTMENT | Freq: Once | CUTANEOUS | Status: AC
  Administered 2018-02-01: 1 via TOPICAL
  Filled 2018-02-01: qty 0.9

## 2018-02-01 MED ORDER — LIDOCAINE HCL (PF) 1 % IJ SOLN
5.0000 mL | Freq: Once | INTRAMUSCULAR | Status: AC
  Administered 2018-02-01: 5 mL via INTRADERMAL
  Filled 2018-02-01: qty 5

## 2018-02-01 MED ORDER — LIDOCAINE HCL (PF) 1 % IJ SOLN
INTRAMUSCULAR | Status: AC
  Filled 2018-02-01: qty 5

## 2018-02-01 NOTE — ED Triage Notes (Signed)
Patient ambulatory to triage with steady gait, without difficulty or distress noted ; pt reports cutting left middle finger PTA; approx 1.5" lac noted with scant bleeding; gauze dressing applied

## 2018-02-01 NOTE — ED Notes (Signed)
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

## 2018-02-01 NOTE — ED Notes (Signed)
Pt to the er for a lac to the third digit of the right hand. Pt cut it on a ceramic handle. Bleeding has stopped now. Last tetanus unknown. Pt able to move finger well.

## 2018-02-01 NOTE — ED Provider Notes (Signed)
[facility] Emergency Department Provider Note  ____________________________________________  Time seen: Approximately 8:39 PM  I have reviewed the triage vital signs and the nursing notes.   HISTORY  Chief Complaint Laceration   HPI Lisa Sexton is a 47 y.o. female who presents to the emergency department for treatment and evaluation of a laceration to the left middle finger.  While at work, she cut her finger on a broken cabinet knob.  Unknown last tetanus booster.  Bleeding well controlled.   Past Medical History:  Diagnosis Date  . Asthma   . COPD (chronic obstructive pulmonary disease) (HCC)     There are no active problems to display for this patient.   History reviewed. No pertinent surgical history.  Prior to Admission medications   Medication Sig Start Date End Date Taking? Authorizing Provider  fluconazole (DIFLUCAN) 150 MG tablet Take 1 tablet (150 mg total) by mouth daily. 09/13/15   Beers, Charmayne Sheerharles M, PA-C  LORazepam (ATIVAN) 0.5 MG tablet Take 1 tablet (0.5 mg total) by mouth every 8 (eight) hours as needed for anxiety. 11/07/17 11/07/18  Minna AntisPaduchowski, Kevin, MD  predniSONE (DELTASONE) 20 MG tablet Take 2 tablets (40 mg total) by mouth daily. 11/07/17   Minna AntisPaduchowski, Kevin, MD  promethazine (PHENERGAN) 25 MG tablet Take 1 tablet (25 mg total) by mouth every 6 (six) hours as needed for nausea or vomiting. 09/13/15   Beers, Charmayne Sheerharles M, PA-C  sulfamethoxazole-trimethoprim (BACTRIM DS,SEPTRA DS) 800-160 MG tablet Take 1 tablet by mouth 2 (two) times daily. 09/13/15   Beers, Charmayne Sheerharles M, PA-C  triamcinolone (KENALOG) 0.1 % paste Use as directed 1 application in the mouth or throat 2 (two) times daily. 08/03/16   Tommi RumpsSummers, Rhonda L, PA-C    Allergies Patient has no known allergies.  No family history on file.  Social History Social History   Tobacco Use  . Smoking status: Current Every Day Smoker    Packs/day: 1.00    Types: Cigarettes  . Smokeless  tobacco: Never Used  Substance Use Topics  . Alcohol use: Yes    Comment: rarely  . Drug use: Not on file    Review of Systems  Constitutional: Negative for fever. Respiratory: Negative for cough or shortness of breath.  Musculoskeletal: Negative for myalgias Skin: Positive for laceration to the left long finger Neurological: Negative for numbness or paresthesias. ____________________________________________   PHYSICAL EXAM:  VITAL SIGNS: ED Triage Vitals  Enc Vitals Group     BP 02/01/18 1921 (!) 149/93     Pulse Rate 02/01/18 1921 76     Resp 02/01/18 1921 19     Temp 02/01/18 1921 98.2 F (36.8 C)     Temp Source 02/01/18 1921 Oral     SpO2 02/01/18 1921 97 %     Weight 02/01/18 1919 190 lb (86.2 kg)     Height 02/01/18 1919 5\' 1"  (1.549 m)     Head Circumference --      Peak Flow --      Pain Score 02/01/18 1919 6     Pain Loc --      Pain Edu? --      Excl. in GC? --      Constitutional: Well appearing. Eyes: Conjunctivae are clear without discharge or drainage. Nose: No rhinorrhea noted. Mouth/Throat: Airway is patent.  Neck: No stridor. Unrestricted range of motion observed. Cardiovascular: Capillary refill is <3 seconds.  Respiratory: Respirations are even and unlabored.. Musculoskeletal: Unrestricted range of motion observed. Neurologic:  Awake, alert, and oriented x 4.  Skin: 2.5 cm laceration to the left middle finger.  ____________________________________________   LABS (all labs ordered are listed, but only abnormal results are displayed)  Labs Reviewed - No data to display ____________________________________________  EKG  Not indicated. ____________________________________________  RADIOLOGY  Not indicated ____________________________________________   PROCEDURES  .Marland KitchenLaceration Repair Date/Time: 02/01/2018 8:40 PM Performed by: Chinita Pester, FNP Authorized by: Chinita Pester, FNP   Consent:    Consent obtained:  Verbal    Consent given by:  Patient   Risks discussed:  Infection, pain, retained foreign body, poor cosmetic result and poor wound healing Anesthesia (see MAR for exact dosages):    Anesthesia method:  Local infiltration and nerve block   Local anesthetic:  Lidocaine 1% w/o epi   Block needle gauge:  25 G   Block injection procedure:  Introduced needle and anatomic landmarks identified   Block outcome:  Anesthesia achieved Laceration details:    Location:  Finger   Finger location:  L long finger   Length (cm):  2.5 Repair type:    Repair type:  Simple Pre-procedure details:    Preparation:  Patient was prepped and draped in usual sterile fashion Exploration:    Hemostasis achieved with:  Direct pressure   Wound exploration: entire depth of wound probed and visualized     Contaminated: no   Treatment:    Area cleansed with:  Saline and Betadine   Amount of cleaning:  Extensive   Irrigation solution:  Sterile saline   Visualized foreign bodies/material removed: no   Skin repair:    Repair method:  Sutures   Suture size:  5-0   Suture material:  Nylon   Suture technique:  Simple interrupted   Number of sutures:  4 Approximation:    Approximation:  Close Post-procedure details:    Dressing:  Sterile dressing and antibiotic ointment   Patient tolerance of procedure:  Tolerated well, no immediate complications   ____________________________________________   INITIAL IMPRESSION / ASSESSMENT AND PLAN / ED COURSE  Lisa Sexton is a 47 y.o. female who presents to the emergency department for treatment and evaluation of laceration.  Wound was sutured without complication.  Wound care instructions were provided to the patient.  She is aware that she will need to have the sutures removed in 10 to 12 days by primary care, urgent care, or return here.  She was advised that she should be seen sooner for any sign or concern of infection.   Medications  lidocaine (PF) (XYLOCAINE) 1 %  injection (has no administration in time range)  lidocaine (PF) (XYLOCAINE) 1 % injection 5 mL (5 mLs Intradermal Given 02/01/18 2038)  Tdap (BOOSTRIX) injection 0.5 mL (0.5 mLs Intramuscular Given 02/01/18 2039)  bacitracin ointment 1 application (1 application Topical Given 02/01/18 2045)     Pertinent labs & imaging results that were available during my care of the patient were reviewed by me and considered in my medical decision making (see chart for details).  ____________________________________________   FINAL CLINICAL IMPRESSION(S) / ED DIAGNOSES  Final diagnoses:  Laceration of left middle finger without foreign body without damage to nail, initial encounter    ED Discharge Orders    None       Note:  This document was prepared using Dragon voice recognition software and may include unintentional dictation errors.    Chinita Pester, FNP 02/01/18 2151    Willy Eddy, MD 02/01/18 2200

## 2018-02-01 NOTE — Discharge Instructions (Signed)
Do not get the sutured area wet for 24 hours. After 24 hours, shower/bathe as usual and pat the area dry. °Change the bandage 2 times per day and apply antibiotic ointment. °Leave open to air when at no risk of getting the area dirty, but cover at night before bed. °See your PCP or go to Urgent Care in 10-12 days for suture removal or sooner for signs or concern of infection. ° °

## 2018-02-13 ENCOUNTER — Encounter: Payer: Self-pay | Admitting: Emergency Medicine

## 2018-02-13 ENCOUNTER — Emergency Department
Admission: EM | Admit: 2018-02-13 | Discharge: 2018-02-13 | Disposition: A | Discharge status: Home / Self Care | Payer: Self-pay | Attending: Emergency Medicine | Admitting: Emergency Medicine

## 2018-02-13 DIAGNOSIS — Y93G1 Activity, food preparation and clean up: Secondary | ICD-10-CM | POA: Insufficient documentation

## 2018-02-13 DIAGNOSIS — S61012A Laceration without foreign body of left thumb without damage to nail, initial encounter: Secondary | ICD-10-CM

## 2018-02-13 DIAGNOSIS — Y999 Unspecified external cause status: Secondary | ICD-10-CM | POA: Insufficient documentation

## 2018-02-13 DIAGNOSIS — W268XXA Contact with other sharp object(s), not elsewhere classified, initial encounter: Secondary | ICD-10-CM | POA: Insufficient documentation

## 2018-02-13 DIAGNOSIS — S61211A Laceration without foreign body of left index finger without damage to nail, initial encounter: Secondary | ICD-10-CM | POA: Insufficient documentation

## 2018-02-13 DIAGNOSIS — Y929 Unspecified place or not applicable: Secondary | ICD-10-CM | POA: Insufficient documentation

## 2018-02-13 MED ORDER — MELOXICAM 7.5 MG PO TABS
15.0000 mg | ORAL_TABLET | Freq: Once | ORAL | Status: AC
  Administered 2018-02-13: 15 mg via ORAL
  Filled 2018-02-13: qty 2

## 2018-02-13 NOTE — ED Triage Notes (Signed)
Patient states that she was using a slicer and cut her left thumb and nail with the slicer. Bleeding controlled at this time.

## 2018-02-13 NOTE — ED Provider Notes (Signed)
[facility] Emergency Department Provider Note  ____________________________________________  Time seen: Approximately 11:30 PM  I have reviewed the triage vital signs and the nursing notes.   HISTORY  Chief Complaint Laceration    HPI Lisa Sexton is a 47 y.o. female presents to the emergency department with a 0.5 cm laceration to the lateral aspect of the left first fingernail sustained with a clean mandolin.  No weakness or radiculopathy.  Patient's tetanus status is up-to-date.  No alleviating measures have been attempted.  Past Medical History:  Diagnosis Date  . Asthma   . COPD (chronic obstructive pulmonary disease) (HCC)     There are no active problems to display for this patient.   Past Surgical History:  Procedure Laterality Date  . ASD REPAIR    . CHOLECYSTECTOMY    . OVARIAN CYST REMOVAL      Prior to Admission medications   Medication Sig Start Date End Date Taking? Authorizing Provider  fluconazole (DIFLUCAN) 150 MG tablet Take 1 tablet (150 mg total) by mouth daily. 09/13/15   Beers, Charmayne Sheer, PA-C  LORazepam (ATIVAN) 0.5 MG tablet Take 1 tablet (0.5 mg total) by mouth every 8 (eight) hours as needed for anxiety. 11/07/17 11/07/18  Minna Antis, MD  predniSONE (DELTASONE) 20 MG tablet Take 2 tablets (40 mg total) by mouth daily. 11/07/17   Minna Antis, MD  promethazine (PHENERGAN) 25 MG tablet Take 1 tablet (25 mg total) by mouth every 6 (six) hours as needed for nausea or vomiting. 09/13/15   Beers, Charmayne Sheer, PA-C  sulfamethoxazole-trimethoprim (BACTRIM DS,SEPTRA DS) 800-160 MG tablet Take 1 tablet by mouth 2 (two) times daily. 09/13/15   Beers, Charmayne Sheer, PA-C  triamcinolone (KENALOG) 0.1 % paste Use as directed 1 application in the mouth or throat 2 (two) times daily. 08/03/16   Tommi Rumps, PA-C    Allergies Patient has no known allergies.  No family history on file.  Social History Social History   Tobacco  Use  . Smoking status: Current Every Day Smoker    Packs/day: 1.00    Types: Cigarettes  . Smokeless tobacco: Never Used  Substance Use Topics  . Alcohol use: Yes    Comment: rarely  . Drug use: Not on file     Review of Systems  Constitutional: No fever/chills Eyes: No visual changes. No discharge ENT: No upper respiratory complaints. Cardiovascular: no chest pain. Respiratory: no cough. No SOB. Gastrointestinal: No abdominal pain.  No nausea, no vomiting.  No diarrhea.  No constipation. Musculoskeletal: Negative for musculoskeletal pain. Skin: Patient has left first digit laceration.  Neurological: Negative for headaches, focal weakness or numbness.   ____________________________________________   PHYSICAL EXAM:  VITAL SIGNS: ED Triage Vitals [02/13/18 2047]  Enc Vitals Group     BP (!) 170/98     Pulse Rate 79     Resp 18     Temp 98.3 F (36.8 C)     Temp Source Oral     SpO2 99 %     Weight 190 lb (86.2 kg)     Height 5\' 1"  (1.549 m)     Head Circumference      Peak Flow      Pain Score 9     Pain Loc      Pain Edu?      Excl. in GC?      Constitutional: Alert and oriented. Well appearing and in no acute distress. Eyes: Conjunctivae are normal. PERRL.  EOMI. Head: Atraumatic. Cardiovascular: Normal rate, regular rhythm. Normal S1 and S2.  Good peripheral circulation. Respiratory: Normal respiratory effort without tachypnea or retractions. Lungs CTAB. Good air entry to the bases with no decreased or absent breath sounds. Musculoskeletal: Full range of motion to all extremities. No gross deformities appreciated.  Palpable radial pulse, left. Neurologic:  Normal speech and language. No gross focal neurologic deficits are appreciated.  Skin: Patient has 0.5 cm superficial laceration through the lateral aspect of the left first fingernail.   ____________________________________________   LABS (all labs ordered are listed, but only abnormal results are  displayed)  Labs Reviewed - No data to display ____________________________________________  EKG   ____________________________________________  RADIOLOGY  No results found.  ____________________________________________    PROCEDURES  Procedure(s) performed:    Procedures  LACERATION REPAIR Performed by: Orvil FeilJaclyn M Woods Authorized by: Orvil FeilJaclyn M Woods Consent: Verbal consent obtained. Risks and benefits: risks, benefits and alternatives were discussed Consent given by: patient Patient identity confirmed: provided demographic data Prepped and Draped in normal sterile fashion Wound explored  Laceration Location: Left first digit   Laceration Length: 0.5 cm  No Foreign Bodies seen or palpated  Anesthesia: None   Irrigation method: syringe Amount of cleaning: standard  Skin closure: Dermabond   Patient tolerance: Patient tolerated the procedure well with no immediate complications.    Medications  meloxicam (MOBIC) tablet 15 mg (15 mg Oral Given 02/13/18 2155)     ____________________________________________   INITIAL IMPRESSION / ASSESSMENT AND PLAN / ED COURSE  Pertinent labs & imaging results that were available during my care of the patient were reviewed by me and considered in my medical decision making (see chart for details).  Review of the [place] CSRS was performed in accordance of the [facility] prior to dispensing any controlled drugs.      Assessment and plan Left first digit laceration Patient presents to the emergency department with a left first digit fingernail laceration.  Laceration was repaired in the emergency department with Dermabond.  Patient was advised to follow-up with primary care as needed.  Tetanus status was up-to-date.  All patient questions were answered.    ____________________________________________  FINAL CLINICAL IMPRESSION(S) / ED DIAGNOSES  Final diagnoses:  Laceration of left thumb without foreign body without damage  to nail, initial encounter      NEW MEDICATIONS STARTED DURING THIS VISIT:  ED Discharge Orders    None          This chart was dictated using voice recognition software/Dragon. Despite best efforts to proofread, errors can occur which can change the meaning. Any change was purely unintentional.    Orvil FeilWoods, Jaclyn M, PA-C 02/13/18 2337    Phineas SemenGoodman, Graydon, MD 02/17/18 [phone]

## 2018-05-26 ENCOUNTER — Emergency Department
Admission: EM | Admit: 2018-05-26 | Discharge: 2018-05-26 | Disposition: A | Discharge status: Home / Self Care | Payer: Self-pay | Attending: Emergency Medicine | Admitting: Emergency Medicine

## 2018-05-26 ENCOUNTER — Encounter: Payer: Self-pay | Admitting: Emergency Medicine

## 2018-05-26 DIAGNOSIS — Z79899 Other long term (current) drug therapy: Secondary | ICD-10-CM | POA: Insufficient documentation

## 2018-05-26 DIAGNOSIS — J45909 Unspecified asthma, uncomplicated: Secondary | ICD-10-CM | POA: Insufficient documentation

## 2018-05-26 DIAGNOSIS — R42 Dizziness and giddiness: Secondary | ICD-10-CM

## 2018-05-26 DIAGNOSIS — Z9049 Acquired absence of other specified parts of digestive tract: Secondary | ICD-10-CM | POA: Insufficient documentation

## 2018-05-26 DIAGNOSIS — Z87891 Personal history of nicotine dependence: Secondary | ICD-10-CM | POA: Insufficient documentation

## 2018-05-26 DIAGNOSIS — B349 Viral infection, unspecified: Secondary | ICD-10-CM | POA: Insufficient documentation

## 2018-05-26 DIAGNOSIS — J449 Chronic obstructive pulmonary disease, unspecified: Secondary | ICD-10-CM | POA: Insufficient documentation

## 2018-05-26 DIAGNOSIS — H81399 Other peripheral vertigo, unspecified ear: Secondary | ICD-10-CM | POA: Insufficient documentation

## 2018-05-26 LAB — URINALYSIS, COMPLETE (UACMP) WITH MICROSCOPIC
BILIRUBIN URINE: NEGATIVE
Bacteria, UA: NONE SEEN
GLUCOSE, UA: NEGATIVE mg/dL
Ketones, ur: NEGATIVE mg/dL
Leukocytes, UA: NEGATIVE
NITRITE: NEGATIVE
PH: 7 (ref 5.0–8.0)
Protein, ur: NEGATIVE mg/dL
SPECIFIC GRAVITY, URINE: 1.005 (ref 1.005–1.030)

## 2018-05-26 LAB — CBC
HCT: 44.7 % (ref 36.0–46.0)
HEMOGLOBIN: 16 g/dL — AB (ref 12.0–15.0)
MCH: 30.2 pg (ref 26.0–34.0)
MCHC: 35.8 g/dL (ref 30.0–36.0)
MCV: 84.5 fL (ref 80.0–100.0)
PLATELETS: 263 10*3/uL (ref 150–400)
RBC: 5.29 MIL/uL — ABNORMAL HIGH (ref 3.87–5.11)
RDW: 12 % (ref 11.5–15.5)
WBC: 9.2 10*3/uL (ref 4.0–10.5)
nRBC: 0 % (ref 0.0–0.2)

## 2018-05-26 LAB — BASIC METABOLIC PANEL
ANION GAP: 11 (ref 5–15)
BUN: 14 mg/dL (ref 6–20)
CALCIUM: 8.8 mg/dL — AB (ref 8.9–10.3)
CO2: 27 mmol/L (ref 22–32)
Chloride: 100 mmol/L (ref 98–111)
Creatinine, Ser: 0.64 mg/dL (ref 0.44–1.00)
GFR calc Af Amer: >60 mL/min (ref >60)
Glucose, Bld: 105 mg/dL — ABNORMAL HIGH (ref 70–99)
POTASSIUM: 2.9 mmol/L — AB (ref 3.5–5.1)
SODIUM: 138 mmol/L (ref 135–145)

## 2018-05-26 LAB — POCT PREGNANCY, URINE: PREG TEST UR: NEGATIVE

## 2018-05-26 MED ORDER — SODIUM CHLORIDE 0.9 % IV BOLUS
1000.0000 mL | Freq: Once | INTRAVENOUS | Status: AC
Start: 2018-05-26 — End: 2018-05-26
  Administered 2018-05-26: 1000 mL via INTRAVENOUS

## 2018-05-26 MED ORDER — GUAIFENESIN-CODEINE 100-10 MG/5ML PO SOLN: 5.0000 mL | Freq: Four times a day (QID) | ORAL | 0 refills | Status: DC | PRN

## 2018-05-26 MED ORDER — MECLIZINE HCL 25 MG PO TABS
25.0000 mg | ORAL_TABLET | Freq: Once | ORAL | Status: AC
  Administered 2018-05-26: 25 mg via ORAL
  Filled 2018-05-26: qty 1

## 2018-05-26 MED ORDER — POTASSIUM CHLORIDE CRYS ER 20 MEQ PO TBCR
40.0000 meq | EXTENDED_RELEASE_TABLET | Freq: Once | ORAL | Status: AC
  Administered 2018-05-26: 40 meq via ORAL
  Filled 2018-05-26: qty 2

## 2018-05-26 MED ORDER — MECLIZINE HCL 25 MG PO TABS: 25.0000 mg | ORAL_TABLET | Freq: Three times a day (TID) | ORAL | 0 refills | Status: DC | PRN

## 2018-05-26 NOTE — ED Provider Notes (Signed)
[facility] Emergency Department Provider Note  ____________________________________________   First MD Initiated Contact with Patient 05/26/18 1707     (approximate)  I have reviewed the triage vital signs and the nursing notes.   HISTORY  Chief Complaint Diarrhea and Dizziness   HPI Lisa Sexton is a 47 y.o. female with a history of asthma as well as COPD who was presented to the emergency department with 1 week of nasal congestion as well as cough, loss of voice and diarrhea over the past 3 days.  She says that she has had 2 episodes of diarrhea per day.  No recent antibiotics.  No known sick contacts.  Says that she has had a subjective fever.  Says that she is presenting the emergency department today because of feeling lightheaded and dizzy like the room is spinning especially with movement.   Past Medical History:  Diagnosis Date  . Asthma   . COPD (chronic obstructive pulmonary disease) (HCC)     There are no active problems to display for this patient.   Past Surgical History:  Procedure Laterality Date  . ASD REPAIR    . CHOLECYSTECTOMY    . OVARIAN CYST REMOVAL      Prior to Admission medications   Medication Sig Start Date End Date Taking? Authorizing Provider  fluconazole (DIFLUCAN) 150 MG tablet Take 1 tablet (150 mg total) by mouth daily. 09/13/15   Beers, Charmayne Sheerharles M, PA-C  LORazepam (ATIVAN) 0.5 MG tablet Take 1 tablet (0.5 mg total) by mouth every 8 (eight) hours as needed for anxiety. 11/07/17 11/07/18  Minna AntisPaduchowski, Kevin, MD  predniSONE (DELTASONE) 20 MG tablet Take 2 tablets (40 mg total) by mouth daily. 11/07/17   Minna AntisPaduchowski, Kevin, MD  promethazine (PHENERGAN) 25 MG tablet Take 1 tablet (25 mg total) by mouth every 6 (six) hours as needed for nausea or vomiting. 09/13/15   Beers, Charmayne Sheerharles M, PA-C  sulfamethoxazole-trimethoprim (BACTRIM DS,SEPTRA DS) 800-160 MG tablet Take 1 tablet by mouth 2 (two) times daily. 09/13/15   Beers,  Charmayne Sheerharles M, PA-C  triamcinolone (KENALOG) 0.1 % paste Use as directed 1 application in the mouth or throat 2 (two) times daily. 08/03/16   Tommi RumpsSummers, Rhonda L, PA-C    Allergies Patient has no known allergies.  No family history on file.  Social History Social History   Tobacco Use  . Smoking status: Former Smoker    Packs/day: 1.00    Types: Cigarettes    Last attempt to quit: 05/02/2018    Years since quitting: 0.0  . Smokeless tobacco: Never Used  Substance Use Topics  . Alcohol use: Yes    Comment: rarely  . Drug use: Not on file    Review of Systems  Constitutional: Subjective fever Eyes: No visual changes. ENT: No sore throat. Cardiovascular: Denies chest pain. Respiratory: Nonproductive cough Gastrointestinal: No abdominal pain.  No nausea, no vomiting.  No constipation. Genitourinary: Negative for dysuria. Musculoskeletal: Negative for back pain. Skin: Negative for rash. Neurological: Negative for headaches, focal weakness or numbness.   ____________________________________________   PHYSICAL EXAM:  VITAL SIGNS: ED Triage Vitals  Enc Vitals Group     BP 05/26/18 1556 124/89     Pulse Rate 05/26/18 1556 75     Resp 05/26/18 1556 18     Temp 05/26/18 1556 98.6 F (37 C)     Temp Source 05/26/18 1556 Oral     SpO2 05/26/18 1556 96 %     Weight 05/26/18 1557  206 lb (93.4 kg)     Height 05/26/18 1557 5\' 1"  (1.549 m)     Head Circumference --      Peak Flow --      Pain Score 05/26/18 1557 2     Pain Loc --      Pain Edu? --      Excl. in GC? --     Constitutional: Alert and oriented. Well appearing and in no acute distress. Eyes: Conjunctivae are normal.  Head: Atraumatic. Nose: No congestion/rhinnorhea.  Small amount of serous effusion to the left ear.  Mild to moderate and greater amount of serous effusion of the right ear.  No TM bulging, bilaterally. Mouth/Throat: Mucous membranes are moist.  No erythema nor is there exudate or swelling any  structures in the posterior pharynx. Neck: No stridor.   Cardiovascular: Normal rate, regular rhythm. Grossly normal heart sounds.  Respiratory: Normal respiratory effort.  No retractions. Lungs CTAB. Gastrointestinal: Soft and nontender. No distention.  Musculoskeletal: No lower extremity tenderness nor edema.  No joint effusions. Neurologic:  Normal speech and language. No gross focal neurologic deficits are appreciated.  No nystagmus.  No ataxia on finger-to-nose nor is there any ataxia on heel-to-shin testing. Skin:  Skin is warm, dry and intact. No rash noted. Psychiatric: Mood and affect are normal. Speech and behavior are normal.  ____________________________________________   LABS (all labs ordered are listed, but only abnormal results are displayed)  Labs Reviewed  BASIC METABOLIC PANEL - Abnormal; Notable for the following components:      Result Value   Potassium 2.9 (*)    Glucose, Bld 105 (*)    Calcium 8.8 (*)    All other components within normal limits  CBC - Abnormal; Notable for the following components:   RBC 5.29 (*)    Hemoglobin 16.0 (*)    All other components within normal limits  URINALYSIS, COMPLETE (UACMP) WITH MICROSCOPIC - Abnormal; Notable for the following components:   Color, Urine STRAW (*)    APPearance CLEAR (*)    Hgb urine dipstick SMALL (*)    All other components within normal limits  POC URINE PREG, ED  POCT PREGNANCY, URINE   ____________________________________________  EKG  ED ECG REPORT I, Arelia Longest, the attending physician, personally viewed and interpreted this ECG.   Date: 05/26/2018  EKG Time: 1606  Rate: 67  Rhythm: normal sinus rhythm  Axis: Normal  Intervals:none  ST&T Change: No ST segment elevation or depression.  No abnormal T wave inversion.  ____________________________________________  RADIOLOGY   ____________________________________________   PROCEDURES  Procedure(s) performed:    Procedures  Critical Care performed:   ____________________________________________   INITIAL IMPRESSION / ASSESSMENT AND PLAN / ED COURSE  Pertinent labs & imaging results that were available during my care of the patient were reviewed by me and considered in my medical decision making (see chart for details).  DDX: Viral syndrome, peripheral vertigo, labyrinthitis, Mnire's disease, upper respiratory infection, viral diarrhea, dehydration, hypokalemia As part of my medical decision making, I reviewed the following data within the electronic MEDICAL RECORD NUMBER Notes from prior ED visits  ----------------------------------------- 7:17 PM on 05/26/2018 -----------------------------------------  After fluids, potassium and meclizine the patient feels much improved.  She says that she was able to walk to the toilet and back without any vertiginous or lightheadedness sensation.  Will be discharged with meclizine as well as guaifenesin with codeine.  Patient says that she is having persistent cough and over-the-counter medications  have been ineffective.  Patient says that she is also recently started to take over-the-counter potassium supplementation at home.  She will continue this.  We also discussed staying hydrated at home with water, chicken broth and Pedialyte.  Patient understand the diagnosis as well as the treatment and willing to comply. ____________________________________________   FINAL CLINICAL IMPRESSION(S) / ED DIAGNOSES  Peripheral vertigo.  Viral syndrome.  NEW MEDICATIONS STARTED DURING THIS VISIT:  New Prescriptions   No medications on file     Note:  This document was prepared using Dragon voice recognition software and may include unintentional dictation errors.     Myrna Blazer, MD 05/26/18 [phone]

## 2018-05-26 NOTE — ED Triage Notes (Signed)
Patient presents to the ED with sinus congestion x 1 week.  Patient states she was taking mucinex and allergra and symptoms are getting worse.  Patient reports dizziness today and diarrhea x 2 days.  Patient reports 10 episodes of diarrhea in 24 hours.  Patient also reports lack of appetite.  Patient is in no obvious distress at this time.

## 2019-12-19 ENCOUNTER — Other Ambulatory Visit: Payer: Self-pay

## 2020-01-06 ENCOUNTER — Emergency Department: Payer: Self-pay

## 2020-01-06 ENCOUNTER — Emergency Department
Admission: EM | Admit: 2020-01-06 | Discharge: 2020-01-06 | Disposition: A | Discharge status: Home / Self Care | Payer: Self-pay | Attending: Emergency Medicine | Admitting: Emergency Medicine

## 2020-01-06 ENCOUNTER — Encounter: Payer: Self-pay | Admitting: Emergency Medicine

## 2020-01-06 ENCOUNTER — Other Ambulatory Visit: Payer: Self-pay

## 2020-01-06 DIAGNOSIS — Z20822 Contact with and (suspected) exposure to covid-19: Secondary | ICD-10-CM | POA: Insufficient documentation

## 2020-01-06 DIAGNOSIS — J069 Acute upper respiratory infection, unspecified: Secondary | ICD-10-CM | POA: Insufficient documentation

## 2020-01-06 DIAGNOSIS — J45909 Unspecified asthma, uncomplicated: Secondary | ICD-10-CM | POA: Insufficient documentation

## 2020-01-06 DIAGNOSIS — Z7952 Long term (current) use of systemic steroids: Secondary | ICD-10-CM | POA: Insufficient documentation

## 2020-01-06 DIAGNOSIS — Z87891 Personal history of nicotine dependence: Secondary | ICD-10-CM | POA: Insufficient documentation

## 2020-01-06 DIAGNOSIS — J449 Chronic obstructive pulmonary disease, unspecified: Secondary | ICD-10-CM | POA: Insufficient documentation

## 2020-01-06 LAB — SARS CORONAVIRUS 2 (TAT 6-24 HRS): SARS Coronavirus 2: NEGATIVE

## 2020-01-06 MED ORDER — LIDOCAINE HCL (PF) 1 % IJ SOLN
INTRAMUSCULAR | Status: AC
  Administered 2020-01-06: 2.1 mL
  Filled 2020-01-06: qty 5

## 2020-01-06 MED ORDER — AZITHROMYCIN 250 MG PO TABS: ORAL_TABLET | ORAL | 0 refills | Status: DC

## 2020-01-06 MED ORDER — CEFTRIAXONE SODIUM 1 G IJ SOLR
1.0000 g | Freq: Once | INTRAMUSCULAR | Status: AC
  Administered 2020-01-06: 1 g via INTRAMUSCULAR
  Filled 2020-01-06: qty 10

## 2020-01-06 MED ORDER — DEXAMETHASONE 10 MG/ML FOR PEDIATRIC ORAL USE
16.0000 mg | Freq: Once | INTRAMUSCULAR | Status: AC
  Administered 2020-01-06: 16 mg via ORAL
  Filled 2020-01-06: qty 2

## 2020-01-06 MED ORDER — PREDNISONE 10 MG PO TABS: ORAL_TABLET | ORAL | 0 refills | Status: DC

## 2020-01-06 NOTE — ED Notes (Signed)
Patient states she had a fever of 101 last night. Patient states she used an albuterol neb this morning.

## 2020-01-06 NOTE — ED Provider Notes (Signed)
[facility] Emergency Department Provider Note  ____________________________________________  Time seen: Approximately 1:01 PM  I have reviewed the triage vital signs and the nursing notes.   HISTORY  Chief Complaint Nasal Congestion    HPI Lisa Sexton is a 49 y.o. female presents to emergency department for evaluation of nasal congestion and productive cough with green sputum for 3 weeks.  Patient was seen by [facility] 3 weeks ago and was given a cough medication.  At the time, she had a fever but this did improve over the course of the last 3 weeks.  Last night she spiked a fever again of 101.  She was a little short of breath this morning but used her nebulizer treatment, which resolved the symptoms.  She was told to stay home from work last night due to her fever.  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  She has not been vaccinated against COVID-19 because she is waiting for her job to give her a bonus to get vaccinated.  No chest pain, vomiting, diarrhea.  Past Medical History:  Diagnosis Date  . Asthma   . COPD (chronic obstructive pulmonary disease) (HCC)     There are no problems to display for this patient.   Past Surgical History:  Procedure Laterality Date  . ASD REPAIR    . CHOLECYSTECTOMY    . OVARIAN CYST REMOVAL      Prior to Admission medications   Medication Sig Start Date End Date Taking? Authorizing Provider  azithromycin (ZITHROMAX Z-PAK) 250 MG tablet Take 2 tablets (500 mg) on  Day 1,  followed by 1 tablet (250 mg) once daily on Days 2 through 5. 01/06/20   Laban Emperor, PA-C  fluconazole (DIFLUCAN) 150 MG tablet Take 1 tablet (150 mg total) by mouth daily. 09/13/15   Beers, Pierce Crane, PA-C  guaiFENesin-codeine 100-10 MG/5ML syrup Take 5 mLs by mouth every 6 (six) hours as needed for cough. 05/26/18   Schaevitz, Randall An, MD  meclizine (ANTIVERT) 25 MG tablet Take 1 tablet (25 mg total) by mouth 3 (three) times daily as  needed for dizziness. 05/26/18   Schaevitz, Randall An, MD  predniSONE (DELTASONE) 10 MG tablet Take 6 tablets on day 1, take 5 tablets on day 2, take 4 tablets on day 3, take 3 tablets on day 4, take 2 tablets on day 5, take 1 tablet on day 6 01/06/20   Laban Emperor, PA-C  promethazine (PHENERGAN) 25 MG tablet Take 1 tablet (25 mg total) by mouth every 6 (six) hours as needed for nausea or vomiting. 09/13/15   Beers, Pierce Crane, PA-C  sulfamethoxazole-trimethoprim (BACTRIM DS,SEPTRA DS) 800-160 MG tablet Take 1 tablet by mouth 2 (two) times daily. 09/13/15   Beers, Pierce Crane, PA-C  triamcinolone (KENALOG) 0.1 % paste Use as directed 1 application in the mouth or throat 2 (two) times daily. 08/03/16   Johnn Hai, PA-C    Allergies Patient has no known allergies.  History reviewed. No pertinent family history.  Social History Social History   Tobacco Use  . Smoking status: Former Smoker    Packs/day: 1.00    Types: Cigarettes    Quit date: 05/02/2018    Years since quitting: 1.6  . Smokeless tobacco: Never Used  Substance Use Topics  . Alcohol use: Yes    Comment: rarely  . Drug use: Not on file     Review of Systems  Constitutional: Positive for fever. Eyes: No  visual changes. No discharge. ENT: Positive for congestion and rhinorrhea. Cardiovascular: No chest pain. Respiratory: Positive for cough. No SOB. Gastrointestinal: No abdominal pain.  No nausea, no vomiting.  No diarrhea.  No constipation. Musculoskeletal: Negative for musculoskeletal pain. Skin: Negative for rash, abrasions, lacerations, ecchymosis. Neurological: Negative for headaches.   ____________________________________________   PHYSICAL EXAM:  VITAL SIGNS: ED Triage Vitals  Enc Vitals Group     BP 01/06/20 1148 136/88     Pulse Rate 01/06/20 1148 76     Resp 01/06/20 1148 (!) 24     Temp 01/06/20 1148 97.7 F (36.5 C)     Temp Source 01/06/20 1148 Oral     SpO2 01/06/20 1148 100 %      Weight 01/06/20 1141 198 lb (89.8 kg)     Height 01/06/20 1141 5\' 1"  (1.549 m)     Head Circumference --      Peak Flow --      Pain Score 01/06/20 1141 3     Pain Loc --      Pain Edu? --      Excl. in GC? --      Constitutional: Alert and oriented. Well appearing and in no acute distress. Eyes: Conjunctivae are normal. PERRL. EOMI. No discharge. Head: Atraumatic. ENT: No frontal and maxillary sinus tenderness.      Ears: Tympanic membranes pearly gray with good landmarks. No discharge.      Nose: Mild congestion/rhinnorhea.      Mouth/Throat: Mucous membranes are moist. Oropharynx non-erythematous. Tonsils not enlarged. No exudates. Uvula midline. Neck: No stridor.   Hematological/Lymphatic/Immunilogical: No cervical lymphadenopathy. Cardiovascular: Normal rate, regular rhythm.  Good peripheral circulation. Respiratory: Normal respiratory effort without tachypnea or retractions. Lungs CTAB. Good air entry to the bases with no decreased or absent breath sounds. Gastrointestinal: Bowel sounds 4 quadrants. Soft and nontender to palpation. No guarding or rigidity. No palpable masses. No distention. Musculoskeletal: Full range of motion to all extremities. No gross deformities appreciated. Neurologic:  Normal speech and language. No gross focal neurologic deficits are appreciated.  Skin:  Skin is warm, dry and intact. No rash noted. Psychiatric: Mood and affect are normal. Speech and behavior are normal. Patient exhibits appropriate insight and judgement.   ____________________________________________   LABS (all labs ordered are listed, but only abnormal results are displayed)  Labs Reviewed  SARS CORONAVIRUS 2 (TAT 6-24 HRS)   ____________________________________________  EKG   ____________________________________________  RADIOLOGY 01/08/20, personally viewed and evaluated these images (plain radiographs) as part of my medical decision making, as well as  reviewing the written report by the radiologist.  DG Chest 2 View  Result Date: 01/06/2020 CLINICAL DATA:  Cough and congestion 3 weeks. EXAM: CHEST - 2 VIEW COMPARISON:  11/07/2017 FINDINGS: Lungs are clear. Cardiomediastinal silhouette and remainder of the exam is unchanged. IMPRESSION: No active cardiopulmonary disease. Electronically Signed   By: 11/09/2017 M.D.   On: 01/06/2020 12:42    ____________________________________________    PROCEDURES  Procedure(s) performed:    Procedures    Medications  cefTRIAXone (ROCEPHIN) injection 1 g (1 g Intramuscular Given 01/06/20 1320)  dexamethasone (DECADRON) 10 MG/ML injection for Pediatric ORAL use 16 mg (16 mg Oral Given 01/06/20 1316)  lidocaine (PF) (XYLOCAINE) 1 % injection (2.1 mLs  Given 01/06/20 1322)     ____________________________________________   INITIAL IMPRESSION / ASSESSMENT AND PLAN / ED COURSE  Pertinent labs & imaging results that were available during my care of the patient  were reviewed by me and considered in my medical decision making (see chart for details).  Review of the [place] CSRS was performed in accordance of the [facility] prior to dispensing any controlled drugs.     Patient's diagnosis is consistent with URI. Vital signs and exam are reassuring.  Chest x-ray negative for acute cardiopulmonary processes.  Covid test is pending.  Patient was given a dose of of Rocephin and Decadron in the emergency department.  Patient appears well and is staying well hydrated. Patient should alternate tylenol and ibuprofen for fever. Patient feels comfortable going home. Patient will be discharged home with prescriptions for azithromycin. Patient is to follow up with primary care as needed or otherwise directed. Patient is given ED precautions to return to the ED for any worsening or new symptoms.   Lisa Sexton was evaluated in Emergency Department on 01/06/2020 for the symptoms described in the history of present  illness. She was evaluated in the context of the global COVID-19 pandemic, which necessitated consideration that the patient might be at risk for infection with the SARS-CoV-2 virus that causes COVID-19. Institutional protocols and algorithms that pertain to the evaluation of patients at risk for COVID-19 are in a state of rapid change based on information released by regulatory bodies including the CDC and federal and state organizations. These policies and algorithms were followed during the patient's care in the ED.  ____________________________________________  FINAL CLINICAL IMPRESSION(S) / ED DIAGNOSES  Final diagnoses:  Upper respiratory tract infection, unspecified type      NEW MEDICATIONS STARTED DURING THIS VISIT:  ED Discharge Orders         Ordered    azithromycin (ZITHROMAX Z-PAK) 250 MG tablet     Discontinue  Reprint     01/06/20 1330    predniSONE (DELTASONE) 10 MG tablet     Discontinue  Reprint     01/06/20 1330              This chart was dictated using voice recognition software/Dragon. Despite best efforts to proofread, errors can occur which can change the meaning. Any change was purely unintentional.    Enid Derry, PA-C 01/06/20 1607    Sharman Cheek, MD 01/07/20 [phone]

## 2020-01-06 NOTE — ED Triage Notes (Addendum)
For three weeks has had nasal congestion and face pain. + green nasal drainage and productive cough.  Doctor gave cough medicine. Has not been given any abx.  No fever.

## 2020-03-18 ENCOUNTER — Other Ambulatory Visit: Payer: Self-pay

## 2020-03-18 ENCOUNTER — Emergency Department: Payer: Self-pay

## 2020-03-18 ENCOUNTER — Emergency Department
Admission: EM | Admit: 2020-03-18 | Discharge: 2020-03-18 | Disposition: A | Discharge status: Home / Self Care | Payer: Self-pay | Attending: Emergency Medicine | Admitting: Emergency Medicine

## 2020-03-18 ENCOUNTER — Encounter: Payer: Self-pay | Admitting: Emergency Medicine

## 2020-03-18 DIAGNOSIS — L03111 Cellulitis of right axilla: Secondary | ICD-10-CM | POA: Insufficient documentation

## 2020-03-18 DIAGNOSIS — R05 Cough: Secondary | ICD-10-CM | POA: Insufficient documentation

## 2020-03-18 DIAGNOSIS — Z79899 Other long term (current) drug therapy: Secondary | ICD-10-CM | POA: Insufficient documentation

## 2020-03-18 DIAGNOSIS — M542 Cervicalgia: Secondary | ICD-10-CM | POA: Insufficient documentation

## 2020-03-18 DIAGNOSIS — Z20822 Contact with and (suspected) exposure to covid-19: Secondary | ICD-10-CM | POA: Insufficient documentation

## 2020-03-18 DIAGNOSIS — E876 Hypokalemia: Secondary | ICD-10-CM

## 2020-03-18 DIAGNOSIS — M791 Myalgia, unspecified site: Secondary | ICD-10-CM | POA: Insufficient documentation

## 2020-03-18 DIAGNOSIS — L0291 Cutaneous abscess, unspecified: Secondary | ICD-10-CM

## 2020-03-18 DIAGNOSIS — J45909 Unspecified asthma, uncomplicated: Secondary | ICD-10-CM | POA: Insufficient documentation

## 2020-03-18 DIAGNOSIS — Z87891 Personal history of nicotine dependence: Secondary | ICD-10-CM | POA: Insufficient documentation

## 2020-03-18 LAB — RESPIRATORY PANEL BY RT PCR (FLU A&B, COVID)
Influenza A by PCR: NEGATIVE
Influenza B by PCR: NEGATIVE
SARS Coronavirus 2 by RT PCR: NEGATIVE

## 2020-03-18 LAB — URINALYSIS, COMPLETE (UACMP) WITH MICROSCOPIC
Bacteria, UA: NONE SEEN
Bilirubin Urine: NEGATIVE
Glucose, UA: NEGATIVE mg/dL
Ketones, ur: NEGATIVE mg/dL
Leukocytes,Ua: NEGATIVE
Nitrite: NEGATIVE
Protein, ur: NEGATIVE mg/dL
Specific Gravity, Urine: 1.008 (ref 1.005–1.030)
pH: 7 (ref 5.0–8.0)

## 2020-03-18 LAB — CBC WITH DIFFERENTIAL/PLATELET
Abs Immature Granulocytes: 0.07 10*3/uL (ref 0.00–0.07)
Basophils Absolute: 0 10*3/uL (ref 0.0–0.1)
Basophils Relative: 0 %
Eosinophils Absolute: 0 10*3/uL (ref 0.0–0.5)
Eosinophils Relative: 0 %
HCT: 40.2 % (ref 36.0–46.0)
Hemoglobin: 13.8 g/dL (ref 12.0–15.0)
Immature Granulocytes: 0 %
Lymphocytes Relative: 9 %
Lymphs Abs: 1.5 10*3/uL (ref 0.7–4.0)
MCH: 30.7 pg (ref 26.0–34.0)
MCHC: 34.3 g/dL (ref 30.0–36.0)
MCV: 89.3 fL (ref 80.0–100.0)
Monocytes Absolute: 1.2 10*3/uL — ABNORMAL HIGH (ref 0.1–1.0)
Monocytes Relative: 8 %
Neutro Abs: 12.9 10*3/uL — ABNORMAL HIGH (ref 1.7–7.7)
Neutrophils Relative %: 83 %
Platelets: 211 10*3/uL (ref 150–400)
RBC: 4.5 MIL/uL (ref 3.87–5.11)
RDW: 12.5 % (ref 11.5–15.5)
WBC: 15.7 10*3/uL — ABNORMAL HIGH (ref 4.0–10.5)
nRBC: 0 % (ref 0.0–0.2)

## 2020-03-18 LAB — COMPREHENSIVE METABOLIC PANEL
ALT: 17 U/L (ref 0–44)
AST: 14 U/L — ABNORMAL LOW (ref 15–41)
Albumin: 4 g/dL (ref 3.5–5.0)
Alkaline Phosphatase: 76 U/L (ref 38–126)
Anion gap: 9 (ref 5–15)
BUN: 11 mg/dL (ref 6–20)
CO2: 23 mmol/L (ref 22–32)
Calcium: 8.7 mg/dL — ABNORMAL LOW (ref 8.9–10.3)
Chloride: 103 mmol/L (ref 98–111)
Creatinine, Ser: 0.73 mg/dL (ref 0.44–1.00)
GFR calc Af Amer: >60 mL/min (ref >60)
GFR calc non Af Amer: >60 mL/min (ref >60)
Glucose, Bld: 127 mg/dL — ABNORMAL HIGH (ref 70–99)
Potassium: 3 mmol/L — ABNORMAL LOW (ref 3.5–5.1)
Sodium: 135 mmol/L (ref 135–145)
Total Bilirubin: 1.2 mg/dL (ref 0.3–1.2)
Total Protein: 7.2 g/dL (ref 6.5–8.1)

## 2020-03-18 LAB — RAPID HIV SCREEN (HIV 1/2 AB+AG)
HIV 1/2 Antibodies: NONREACTIVE
HIV-1 P24 Antigen - HIV24: NONREACTIVE

## 2020-03-18 LAB — LACTIC ACID, PLASMA: Lactic Acid, Venous: 0.8 mmol/L (ref 0.5–1.9)

## 2020-03-18 LAB — GROUP A STREP BY PCR: Group A Strep by PCR: NOT DETECTED

## 2020-03-18 LAB — PREGNANCY, URINE: Preg Test, Ur: NEGATIVE

## 2020-03-18 MED ORDER — SULFAMETHOXAZOLE-TRIMETHOPRIM 800-160 MG PO TABS: 1.0000 | ORAL_TABLET | Freq: Two times a day (BID) | ORAL | 0 refills | Status: DC

## 2020-03-18 MED ORDER — CEPHALEXIN 500 MG PO CAPS: 500.0000 mg | ORAL_CAPSULE | Freq: Four times a day (QID) | ORAL | 0 refills | Status: AC

## 2020-03-18 MED ORDER — OXYCODONE HCL 5 MG PO TABS
5.0000 mg | ORAL_TABLET | Freq: Once | ORAL | Status: AC
  Administered 2020-03-18: 5 mg via ORAL
  Filled 2020-03-18: qty 1

## 2020-03-18 MED ORDER — OXYCODONE-ACETAMINOPHEN 5-325 MG PO TABS: 1.0000 | ORAL_TABLET | Freq: Once | ORAL | Status: DC

## 2020-03-18 MED ORDER — TRAMADOL HCL 50 MG PO TABS: 50.0000 mg | ORAL_TABLET | Freq: Four times a day (QID) | ORAL | 0 refills | Status: DC | PRN

## 2020-03-18 MED ORDER — LIDOCAINE HCL (PF) 1 % IJ SOLN
5.0000 mL | Freq: Once | INTRAMUSCULAR | Status: AC
  Administered 2020-03-18: 5 mL via INTRADERMAL
  Filled 2020-03-18: qty 5

## 2020-03-18 MED ORDER — SULFAMETHOXAZOLE-TRIMETHOPRIM 800-160 MG PO TABS
1.0000 | ORAL_TABLET | Freq: Once | ORAL | Status: AC
  Administered 2020-03-18: 1 via ORAL
  Filled 2020-03-18: qty 1

## 2020-03-18 MED ORDER — IOHEXOL 300 MG/ML  SOLN
75.0000 mL | Freq: Once | INTRAMUSCULAR | Status: AC | PRN
  Administered 2020-03-18: 75 mL via INTRAVENOUS
  Filled 2020-03-18: qty 75

## 2020-03-18 MED ORDER — POTASSIUM CHLORIDE ER 10 MEQ PO TBCR: 10.0000 meq | EXTENDED_RELEASE_TABLET | Freq: Every day | ORAL | 0 refills | Status: DC

## 2020-03-18 MED ORDER — SODIUM CHLORIDE 0.9 % IV SOLN
1.0000 g | Freq: Once | INTRAVENOUS | Status: AC
  Administered 2020-03-18: 1 g via INTRAVENOUS
  Filled 2020-03-18: qty 10

## 2020-03-18 MED ORDER — SODIUM CHLORIDE 0.9 % IV BOLUS
1000.0000 mL | Freq: Once | INTRAVENOUS | Status: AC
  Administered 2020-03-18: 1000 mL via INTRAVENOUS

## 2020-03-18 MED ORDER — POTASSIUM CHLORIDE CRYS ER 20 MEQ PO TBCR
40.0000 meq | EXTENDED_RELEASE_TABLET | Freq: Once | ORAL | Status: AC
  Administered 2020-03-18: 40 meq via ORAL
  Filled 2020-03-18: qty 2

## 2020-03-18 MED ORDER — ACETAMINOPHEN 500 MG PO TABS
1000.0000 mg | ORAL_TABLET | Freq: Once | ORAL | Status: AC
  Administered 2020-03-18: 1000 mg via ORAL
  Filled 2020-03-18: qty 2

## 2020-03-18 MED ORDER — IBUPROFEN 400 MG PO TABS
400.0000 mg | ORAL_TABLET | Freq: Once | ORAL | Status: AC
  Administered 2020-03-18: 400 mg via ORAL

## 2020-03-18 NOTE — ED Notes (Signed)
Cultures sent to lab

## 2020-03-18 NOTE — ED Provider Notes (Signed)
-----------------------------------------   5:17 PM on 03/18/2020 -----------------------------------------  Blood pressure 107/68, pulse 83, temperature (!) 101.2 F (38.4 C), temperature source Oral, resp. rate 16, height 5\' 1"  (1.549 m), weight 89.8 kg, SpO2 97 %.  Assuming care from , PA-C.  In short, Lisa Sexton is a 49 y.o. female with a chief complaint of Abscess .  Refer to the original H&P for additional details.  The current plan of care is to await CT results and disposition accordingly.  ____________________________  RADIOLOGY  CT CHEST / SOFT TISSUE NECK w/ CM  IMPRESSION: 1. Extensive soft tissue stranding in the right axilla with a small amount of subcutaneous air in an area of mild focal skin thickening in the lateral aspect of the upper axilla. This is compatible with infection with air introduced with recent superficial abscess drainage. No fluid collection is seen. 2. Minimally asymmetrically enlarged right axillary lymph nodes, compatible with minimally reactive nodes. 3. Moderate chronic right sphenoid sinusitis. ________________________________  DISPOSITION  Patient with ED evaluation and management of fevers, malaise, and fatigue.  She presented with tenderness and pain to the right axilla.  She was found to be  febrile on presentation.  She was also found to have an elevated white count of 15.7.  Extensive work-up including viral testing, chest x-ray, and CT imaging revealed soft tissue stranding in the right axilla with a small area of subcu air consistent with a recently I&D abscess.  Patient was also found to have some reactive lymph nodes in the right axilla and right neck.  She was given IV fluids and IV antibiotics in the ED.  Patient was reevaluated after CT results are available.  I discussed my recommendation of the patient be admitted to the hospitalist service for IV antibiotics considering she has a nondrainable cellulitis of the  armpit.  Patient was not agreeable to the plan at this time, stated that she just wants to go home.  I advised that the patient was at increased risk for progression of her infection including developing sepsis related to your infection.  I advised that she would have to closely monitor her symptoms and immediately return to the ED for worsening symptoms.  She verbalized understanding and is discharged at this time on her own care.  Patient will be discharged with prescriptions for Keflex and Bactrim at this time.  Pain medicine is also provided and strict return precautions are again reviewed.    59, PA-C 03/18/20 1725    05/18/20, MD 03/18/20 [phone]

## 2020-03-18 NOTE — ED Provider Notes (Signed)
[facility] Emergency Department Provider Note  ____________________________________________  Time seen: Approximately 11:40 AM  I have reviewed the triage vital signs and the nursing notes.   HISTORY  Chief Complaint No chief complaint on file.    HPI Lisa Sexton is a 49 y.o. female that presents to the emergency department for evaluation of fever, chills, body aches, right sided neck pain, intermittent nonproductive cough, and possible abscess to right axilla for 2 days.  Patient was evaluated at [facility] yesterday.  She tested negative for Covid and influenza.  She was told that she likely had a viral illness and should begin warm compresses to her right axilla for folliculitis.  This morning, she had difficulty lifting her right arm due to the pain in her axilla and decided to return to the emergency department.  No sick contacts.  She has had 1 dose of the Covid vaccine.  No headache, nasal congestion, shortness of breath, vomiting, abdominal pain, diarrhea.   Past Medical History:  Diagnosis Date   Asthma    COPD (chronic obstructive pulmonary disease) (HCC)     There are no problems to display for this patient.   Past Surgical History:  Procedure Laterality Date   ASD REPAIR     CHOLECYSTECTOMY     OVARIAN CYST REMOVAL      Prior to Admission medications   Medication Sig Start Date End Date Taking? Authorizing Provider  doxycycline (MONODOX) 100 MG capsule Take 100 mg by mouth 2 (two) times daily.   Yes [provider]  cephALEXin (KEFLEX) 500 MG capsule Take 1 capsule (500 mg total) by mouth 4 (four) times daily for 10 days. 03/18/20 03/28/20  Enid Derry, PA-C  potassium chloride (KLOR-CON) 10 MEQ tablet Take 1 tablet (10 mEq total) by mouth daily. 03/18/20   Enid Derry, PA-C  sulfamethoxazole-trimethoprim (BACTRIM DS) 800-160 MG tablet Take 1 tablet by mouth 2 (two) times daily. 03/18/20   Enid Derry, PA-C  traMADol (ULTRAM)  50 MG tablet Take 1 tablet (50 mg total) by mouth every 6 (six) hours as needed. 03/18/20 03/18/21  Enid Derry, PA-C  triamcinolone (KENALOG) 0.1 % paste Use as directed 1 application in the mouth or throat 2 (two) times daily. 08/03/16   Tommi Rumps, PA-C    Allergies Patient has no known allergies.  No family history on file.  Social History Social History   Tobacco Use   Smoking status: Former Smoker    Packs/day: 1.00    Types: Cigarettes    Quit date: 05/02/2018    Years since quitting: 1.8   Smokeless tobacco: Never Used  Substance Use Topics   Alcohol use: Yes    Comment: rarely   Drug use: Not on file     Review of Systems  Constitutional: Positive for fever. ENT: No upper respiratory complaints. Cardiovascular: No chest pain. Respiratory: Positive for occasional cough. No SOB. Gastrointestinal: No abdominal pain.  No nausea, no vomiting.  Musculoskeletal: Positive for body aches. Skin: Negative for abrasions, lacerations, ecchymosis. Positive for rash. Neurological: Negative for headaches  ____________________________________________   PHYSICAL EXAM:  VITAL SIGNS: ED Triage Vitals  Enc Vitals Group     BP 03/18/20 0936 111/67     Pulse Rate 03/18/20 0934 92     Resp 03/18/20 0934 16     Temp 03/18/20 0934 (!) 101 F (38.3 C)     Temp Source 03/18/20 0934 Oral     SpO2 03/18/20 0934 97 %  Weight 03/18/20 0935 197 lb 15.6 oz (89.8 kg)     Height 03/18/20 0935 5\' 1"  (1.549 m)     Head Circumference --      Peak Flow --      Pain Score 03/18/20 0935 10     Pain Loc --      Pain Edu? --      Excl. in GC? --      Constitutional: Alert and oriented. Well appearing and in no acute distress. Eyes: Conjunctivae are normal. PERRL. EOMI. Head: Atraumatic. ENT:      Ears:      Nose: No congestion/rhinnorhea.      Mouth/Throat: Mucous membranes are moist.  Neck: No stridor.  Cardiovascular: Normal rate, regular rhythm.  Good peripheral  circulation. Respiratory: Normal respiratory effort without tachypnea or retractions. Lungs CTAB. Good air entry to the bases with no decreased or absent breath sounds. Musculoskeletal: Full range of motion to all extremities. No gross deformities appreciated. Neurologic:  Normal speech and language. No gross focal neurologic deficits are appreciated.  Skin:  Skin is warm, dry.  1 cm x 1 cm area of erythema to right axilla with 3 inches of surrounding induration. Very tender to palpation. Psychiatric: Mood and affect are normal. Speech and behavior are normal. Patient exhibits appropriate insight and judgement.   ____________________________________________   LABS (all labs ordered are listed, but only abnormal results are displayed)  Labs Reviewed  CBC WITH DIFFERENTIAL/PLATELET - Abnormal; Notable for the following components:      Result Value   WBC 15.7 (*)    Neutro Abs 12.9 (*)    Monocytes Absolute 1.2 (*)    All other components within normal limits  COMPREHENSIVE METABOLIC PANEL - Abnormal; Notable for the following components:   Potassium 3.0 (*)    Glucose, Bld 127 (*)    Calcium 8.7 (*)    AST 14 (*)    All other components within normal limits  URINALYSIS, COMPLETE (UACMP) WITH MICROSCOPIC - Abnormal; Notable for the following components:   Color, Urine YELLOW (*)    APPearance HAZY (*)    Hgb urine dipstick SMALL (*)    All other components within normal limits  RESPIRATORY PANEL BY RT PCR (FLU A&B, COVID)  GROUP A STREP BY PCR  CULTURE, BLOOD (ROUTINE X 2)  CULTURE, BLOOD (ROUTINE X 2)  LACTIC ACID, PLASMA  RAPID HIV SCREEN (HIV 1/2 AB+AG)  PREGNANCY, URINE  POC URINE PREG, ED   ____________________________________________  EKG  SR ____________________________________________  RADIOLOGY 05/18/20, personally viewed and evaluated these images (plain radiographs) as part of my medical decision making, as well as reviewing the written report by the  radiologist.  Lexine Baton AXILLA RIGHT  Result Date: 03/18/2020 CLINICAL DATA:  Right axillary pain and swelling, evaluate for abscess EXAM: ULTRASOUND OF Right AXILLARY SOFT TISSUES TECHNIQUE: Ultrasound examination of the axillary soft tissues was performed in the area of clinical concern. COMPARISON:  None. FINDINGS: Targeted ultrasound was performed in the area of clinical concern (right axilla). No mass/lymph node, fluid collection/abscess, or sonographic abnormality is seen in the area of clinical concern (right axilla). IMPRESSION: No sonographic abnormality is seen in the area of clinical concern (right axilla). Specifically, no fluid collection/abscess is visualized. Electronically Signed   By: 05/18/2020 M.D.   On: 03/18/2020 12:39    ____________________________________________    PROCEDURES  Procedure(s) performed:    Procedures  INCISION AND DRAINAGE Performed by: 05/18/2020 Consent: Verbal consent  obtained. Risks and benefits: risks, benefits and alternatives were discussed Type: abscess  Body area: right axilla  Anesthesia: local infiltration  Incision was made with a scalpel.  Local anesthetic: lidocaine 1 % without epinephrine  Anesthetic total: 2 ml  Drainage: none  Patient tolerance: Patient tolerated the procedure well with no immediate complications.    Medications  lidocaine (PF) (XYLOCAINE) 1 % injection 5 mL (has no administration in time range)  cefTRIAXone (ROCEPHIN) 1 g in sodium chloride 0.9 % 100 mL IVPB (has no administration in time range)  sulfamethoxazole-trimethoprim (BACTRIM DS) 800-160 MG per tablet 1 tablet (has no administration in time range)  oxyCODONE (Oxy IR/ROXICODONE) immediate release tablet 5 mg (has no administration in time range)  acetaminophen (TYLENOL) tablet 1,000 mg (has no administration in time range)  ibuprofen (ADVIL) tablet 400 mg (400 mg Oral Given 03/18/20 0940)  sodium chloride 0.9 % bolus 1,000 mL (0 mLs  Intravenous Stopped 03/18/20 1305)  potassium chloride SA (KLOR-CON) CR tablet 40 mEq (40 mEq Oral Given 03/18/20 1305)     ____________________________________________   INITIAL IMPRESSION / ASSESSMENT AND PLAN / ED COURSE  Pertinent labs & imaging results that were available during my care of the patient were reviewed by me and considered in my medical decision making (see chart for details).  Review of the [place] CSRS was performed in accordance of the [facility] prior to dispensing any controlled drugs.   Patient presented to the emergency department for evaluation of fever, body aches, chills, possible abscess to right axilla for 2 days. Patient was febrile with a fever of 101 on arrival to the emergency department.  Patient has a large area of cellulitis to the right axilla on exam without palpable abscess.  Ultrasound and lab work were was ordered to further evaluate. She has a leukocytosis of 15.9 on WBC.  Lactic acid within normal range.  Blood cultures are pending.  CMP remarkable for potassium of 3.0, which was supplemented with oral potassium.  HIV is negative.  Patient was given motrin and tylenol for her fever.  Fluids and IV Rocephin were started for large cellulitis.  Ultrasound reveals no abscess.  Based on patient's pain and pinpoint tenderness, we elected to attempt incision and drainag, which did not produce any purulent drainage.   At this time, patient does not want to remain in the hospital for continued care. CT scan was ordered to further evaluate cellulitis and is in process at this time.  Care was transferred to [facility] pending CT scan results.   LILANA BLASKO was evaluated in Emergency Department on 03/18/2020 for the symptoms described in the history of present illness. She was evaluated in the context of the global COVID-19 pandemic, which necessitated consideration that the patient might be at risk for infection with the SARS-CoV-2 virus that causes COVID-19. Institutional  protocols and algorithms that pertain to the evaluation of patients at risk for COVID-19 are in a state of rapid change based on information released by regulatory bodies including the CDC and federal and state organizations. These policies and algorithms were followed during the patient's care in the ED.  ____________________________________________  FINAL CLINICAL IMPRESSION(S) / ED DIAGNOSES  Cellulitis   NEW MEDICATIONS STARTED DURING THIS VISIT:  ED Discharge Orders         Ordered    sulfamethoxazole-trimethoprim (BACTRIM DS) 800-160 MG tablet  2 times daily        03/18/20 1511    cephALEXin (KEFLEX) 500 MG capsule  4 times daily        03/18/20 1511    potassium chloride (KLOR-CON) 10 MEQ tablet  Daily        03/18/20 1511    traMADol (ULTRAM) 50 MG tablet  Every 6 hours PRN        03/18/20 1511              This chart was dictated using voice recognition software/Dragon. Despite best efforts to proofread, errors can occur which can change the meaning. Any change was purely unintentional.    Enid DerryWagner, Nelida Mandarino, PA-C 03/19/20 1355    Dionne BucySiadecki, Sebastian, MD 03/19/20 1511

## 2020-03-18 NOTE — ED Notes (Signed)
Per lab, 2nd set of cultures were collected

## 2020-03-18 NOTE — ED Notes (Signed)
US at bedside

## 2020-03-18 NOTE — Discharge Instructions (Addendum)
You have a large area of cellulitis/infection to the right armpit.  You should take the antibiotics as prescribed.  Continue to monitor and treat fevers with ibuprofen.  Take the pain medicine as needed.  Return to the ED immediately for worsening symptoms including uncontrolled fevers, nausea, vomiting, or pain.

## 2020-03-18 NOTE — ED Notes (Signed)
See triage note  Presents with abscess area under arm  States area is painful and draining some   Was seen at [facility] and placed on doxy yesterday

## 2020-03-18 NOTE — ED Notes (Signed)
2nd RN to attempt stick for 2nd set of blood cultures d/t difficult stick

## 2020-03-18 NOTE — ED Triage Notes (Signed)
Right axilla abscess.  States has been expressing liquid drainage.  Area reddend.  Patient c/o pain.  Seen through [facility] ED yesterday and tested negative for COVID<

## 2020-03-19 ENCOUNTER — Telehealth: Payer: Self-pay | Admitting: Emergency Medicine

## 2020-03-19 MED ORDER — CEPHALEXIN 500 MG PO CAPS: 500.0000 mg | ORAL_CAPSULE | Freq: Four times a day (QID) | ORAL | 0 refills | Status: AC

## 2020-03-19 MED ORDER — SULFAMETHOXAZOLE-TRIMETHOPRIM 800-160 MG PO TABS: 1.0000 | ORAL_TABLET | Freq: Two times a day (BID) | ORAL | 0 refills | Status: DC

## 2020-03-19 NOTE — Telephone Encounter (Cosign Needed)
Patient is requesting prescriptions be sent to another pharmacy.

## 2020-03-20 ENCOUNTER — Emergency Department: Admission: EM | Admit: 2020-03-20 | Discharge: 2020-03-20 | Discharge status: Against Medical Advice | Payer: Self-pay

## 2020-03-20 ENCOUNTER — Other Ambulatory Visit: Payer: Self-pay

## 2020-03-20 NOTE — ED Notes (Signed)
Pt in triage reports that she is not able to wait and has an already scheduled appointment with PCP in the AM at 0800. Pt provided with risk and educated to come back ASAP if worsening.

## 2020-03-23 LAB — CULTURE, BLOOD (ROUTINE X 2)
Culture: NO GROWTH
Culture: NO GROWTH
Special Requests: ADEQUATE
Special Requests: ADEQUATE

## 2020-06-09 ENCOUNTER — Emergency Department
Admission: EM | Admit: 2020-06-09 | Discharge: 2020-06-09 | Disposition: A | Discharge status: Home / Self Care | Payer: Self-pay | Attending: Emergency Medicine | Admitting: Emergency Medicine

## 2020-06-09 ENCOUNTER — Other Ambulatory Visit: Payer: Self-pay

## 2020-06-09 ENCOUNTER — Emergency Department: Payer: Self-pay

## 2020-06-09 DIAGNOSIS — Z20822 Contact with and (suspected) exposure to covid-19: Secondary | ICD-10-CM | POA: Insufficient documentation

## 2020-06-09 DIAGNOSIS — N390 Urinary tract infection, site not specified: Secondary | ICD-10-CM | POA: Insufficient documentation

## 2020-06-09 DIAGNOSIS — J449 Chronic obstructive pulmonary disease, unspecified: Secondary | ICD-10-CM | POA: Insufficient documentation

## 2020-06-09 DIAGNOSIS — Z1152 Encounter for screening for COVID-19: Secondary | ICD-10-CM

## 2020-06-09 DIAGNOSIS — J069 Acute upper respiratory infection, unspecified: Secondary | ICD-10-CM | POA: Insufficient documentation

## 2020-06-09 DIAGNOSIS — J4541 Moderate persistent asthma with (acute) exacerbation: Secondary | ICD-10-CM | POA: Insufficient documentation

## 2020-06-09 DIAGNOSIS — J45909 Unspecified asthma, uncomplicated: Secondary | ICD-10-CM | POA: Insufficient documentation

## 2020-06-09 DIAGNOSIS — J45901 Unspecified asthma with (acute) exacerbation: Secondary | ICD-10-CM

## 2020-06-09 DIAGNOSIS — Z87891 Personal history of nicotine dependence: Secondary | ICD-10-CM | POA: Insufficient documentation

## 2020-06-09 LAB — BASIC METABOLIC PANEL
Anion gap: 10 (ref 5–15)
BUN: 12 mg/dL (ref 6–20)
CO2: 25 mmol/L (ref 22–32)
Calcium: 8.6 mg/dL — ABNORMAL LOW (ref 8.9–10.3)
Chloride: 105 mmol/L (ref 98–111)
Creatinine, Ser: 0.8 mg/dL (ref 0.44–1.00)
GFR, Estimated: >60 mL/min (ref >60)
Glucose, Bld: 133 mg/dL — ABNORMAL HIGH (ref 70–99)
Potassium: 3.5 mmol/L (ref 3.5–5.1)
Sodium: 140 mmol/L (ref 135–145)

## 2020-06-09 LAB — URINALYSIS, COMPLETE (UACMP) WITH MICROSCOPIC
Bilirubin Urine: NEGATIVE
Glucose, UA: NEGATIVE mg/dL
Ketones, ur: NEGATIVE mg/dL
Nitrite: NEGATIVE
Protein, ur: 30 mg/dL — AB
Specific Gravity, Urine: 1.027 (ref 1.005–1.030)
WBC, UA: >50 WBC/hpf — ABNORMAL HIGH (ref 0–5)
pH: 5 (ref 5.0–8.0)

## 2020-06-09 LAB — RESP PANEL BY RT-PCR (FLU A&B, COVID) ARPGX2
Influenza A by PCR: NEGATIVE
Influenza B by PCR: NEGATIVE
SARS Coronavirus 2 by RT PCR: NEGATIVE

## 2020-06-09 LAB — CBC
HCT: 44.7 % (ref 36.0–46.0)
Hemoglobin: 15.3 g/dL — ABNORMAL HIGH (ref 12.0–15.0)
MCH: 30.3 pg (ref 26.0–34.0)
MCHC: 34.2 g/dL (ref 30.0–36.0)
MCV: 88.5 fL (ref 80.0–100.0)
Platelets: 244 10*3/uL (ref 150–400)
RBC: 5.05 MIL/uL (ref 3.87–5.11)
RDW: 13.1 % (ref 11.5–15.5)
WBC: 6.9 10*3/uL (ref 4.0–10.5)
nRBC: 0 % (ref 0.0–0.2)

## 2020-06-09 LAB — TROPONIN I (HIGH SENSITIVITY): Troponin I (High Sensitivity): 5 ng/L (ref <18)

## 2020-06-09 MED ORDER — GUAIFENESIN-CODEINE 100-10 MG/5ML PO SOLN
5.0000 mL | Freq: Four times a day (QID) | ORAL | 0 refills | Status: DC | PRN
Start: 2020-06-09 — End: 2020-06-09

## 2020-06-09 MED ORDER — IPRATROPIUM-ALBUTEROL 0.5-2.5 (3) MG/3ML IN SOLN
3.0000 mL | Freq: Once | RESPIRATORY_TRACT | Status: AC
  Administered 2020-06-09: 3 mL via RESPIRATORY_TRACT
  Filled 2020-06-09: qty 3

## 2020-06-09 MED ORDER — ALBUTEROL SULFATE (2.5 MG/3ML) 0.083% IN NEBU
5.0000 mg | INHALATION_SOLUTION | Freq: Once | RESPIRATORY_TRACT | Status: DC
  Filled 2020-06-09: qty 6

## 2020-06-09 MED ORDER — HYDROCOD POLST-CPM POLST ER 10-8 MG/5ML PO SUER: 5.0000 mL | Freq: Two times a day (BID) | ORAL | 0 refills | Status: DC | PRN

## 2020-06-09 MED ORDER — CEPHALEXIN 500 MG PO CAPS: 500.0000 mg | ORAL_CAPSULE | Freq: Three times a day (TID) | ORAL | 0 refills | Status: DC

## 2020-06-09 MED ORDER — PREDNISONE 10 MG PO TABS: ORAL_TABLET | ORAL | 0 refills | Status: DC

## 2020-06-09 MED ORDER — PREDNISONE 20 MG PO TABS
60.0000 mg | ORAL_TABLET | Freq: Once | ORAL | Status: AC
  Administered 2020-06-09: 60 mg via ORAL
  Filled 2020-06-09: qty 3

## 2020-06-09 MED ORDER — ALBUTEROL SULFATE HFA 108 (90 BASE) MCG/ACT IN AERS: 2.0000 | INHALATION_SPRAY | Freq: Four times a day (QID) | RESPIRATORY_TRACT | 2 refills | Status: AC | PRN

## 2020-06-09 NOTE — ED Notes (Signed)
Pt presents to the ED. Pt states that she has had chest "fullness", productive cough, SOB, and head "fullness". Pt has a hx of COPD and asthma. Pt states she has been feeling this way for the past 2 days. Pt tried OTC medication without relief. Pt A&Ox4 and NAD. Denies pain

## 2020-06-09 NOTE — Discharge Instructions (Addendum)
Follow-up with your primary care provider if any continued problems or concerns.  You may also follow-up with [facility] acute care if any continued problems.  A new inhaler was sent to your pharmacy along with continued prednisone that you will continue tomorrow as you had the first dose while you are in the emergency department.  You also have a urinary tract infection and antibiotics were sent to your pharmacy.  This is 3 times a day for 10 days.  Do not stop taking this until completely finished.  The narcotic cough medication can be taken every 6 hours.  Be aware that this medication could cause drowsiness and increase your risk for injury.  You may also take Tylenol with these medications for any fever, body aches, headache or pain.  Increase fluids which will also help with your urinary tract infection.

## 2020-06-09 NOTE — ED Triage Notes (Addendum)
Pt BIB EMS for cough and congestion. Pt states she woke up 3 times in the night with cough and difficulty breathing. Pt states she used her at home inhaler and got some relief, and came in to be checked out.  Pt also states she believes she has a UTI.

## 2020-06-09 NOTE — ED Provider Notes (Signed)
[facility] Emergency Department Provider Note  ____________________________________________   First MD Initiated Contact with Patient 06/09/20 [phone]     (approximate)  I have reviewed the triage vital signs and the nursing notes.   HISTORY  Chief Complaint Cough and Shortness of Breath   HPI Lisa Sexton is a 49 y.o. female presents to the ED with complaint of productive cough, shortness of breath, head congestion and chest "fullness".  Patient also has a history of COPD and asthma and states that she is an inhaler.  She states that initially she was unable to find her inhaler and when she became short of breath with her wheezing she called 911.  She states that she found her inhaler when EMS was already in her driveway.  She also reports a subjective fever but denies any nausea, vomiting, diarrhea.  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  She states she also needs a Covid test before they will allow her to return to work.  In talking with her she believes that she has a urinary tract infection with dysuria.  She states this is been a long time since she has had these symptoms.  She rates her pain as 4 out of 10.  Patient was given prednisone and a DuoNeb treatment while in triage before being examined in the ED by this provider.       Past Medical History:  Diagnosis Date  . Asthma   . COPD (chronic obstructive pulmonary disease) (HCC)     There are no problems to display for this patient.   Past Surgical History:  Procedure Laterality Date  . ASD REPAIR    . CHOLECYSTECTOMY    . OVARIAN CYST REMOVAL      Prior to Admission medications   Medication Sig Start Date End Date Taking? Authorizing Provider  albuterol (VENTOLIN HFA) 108 (90 Base) MCG/ACT inhaler Inhale 2 puffs into the lungs every 6 (six) hours as needed for wheezing or shortness of breath. 06/09/20   Tommi Rumps, PA-C  cephALEXin (KEFLEX) 500 MG capsule  Take 1 capsule (500 mg total) by mouth 3 (three) times daily. 06/09/20   Tommi Rumps, PA-C  doxycycline (MONODOX) 100 MG capsule Take 100 mg by mouth 2 (two) times daily.    [provider]  guaiFENesin-codeine 100-10 MG/5ML syrup Take 5 mLs by mouth every 6 (six) hours as needed for cough. 06/09/20   Tommi Rumps, PA-C  predniSONE (DELTASONE) 10 MG tablet Take 5 tablets tomorrow, on day 2 take 4 tablets, day 3 take 3 tablets, day 4 take 2 tablets, day 5 take 1 tablet 06/09/20   Bridget Hartshorn L, PA-C  potassium chloride (KLOR-CON) 10 MEQ tablet Take 1 tablet (10 mEq total) by mouth daily. 03/18/20 06/09/20  Enid Derry, PA-C    Allergies Patient has no known allergies.  History reviewed. No pertinent family history.  Social History Social History   Tobacco Use  . Smoking status: Former Smoker    Packs/day: 1.00    Types: Cigarettes    Quit date: 05/02/2018    Years since quitting: 2.1  . Smokeless tobacco: Never Used  Substance Use Topics  . Alcohol use: Yes    Comment: rarely  . Drug use: Not on file    Review of Systems Constitutional: Subjective fever/chills Eyes: No visual changes. ENT: No sore throat.  Positive for nasal congestion. Cardiovascular: Denies chest pain. Respiratory: Positive for shortness of breath and  cough.  Positive for wheezing. Gastrointestinal: No abdominal pain.  No nausea, no vomiting.  No diarrhea.   Genitourinary: Positive dysuria. Musculoskeletal: Positive muscle skeletal aches. Skin: Negative for rash. Neurological: Negative for headaches, focal weakness or numbness. ___________________________________________   PHYSICAL EXAM:  VITAL SIGNS: ED Triage Vitals  Enc Vitals Group     BP 06/09/20 0510 (!) 149/73     Pulse Rate 06/09/20 0510 76     Resp 06/09/20 0510 (!) 26     Temp 06/09/20 0510 98.3 F (36.8 C)     Temp src --      SpO2 06/09/20 0510 96 %     Weight 06/09/20 0511 195 lb (88.5 kg)     Height  06/09/20 0511 5\' 1"  (1.549 m)     Head Circumference --      Peak Flow --      Pain Score 06/09/20 0511 4     Pain Loc --      Pain Edu? --      Excl. in GC? --     Constitutional: Alert and oriented. Well appearing and in no acute distress. Eyes: Conjunctivae are normal.  Head: Atraumatic. Nose: Mild congestion/no rhinnorhea. Mouth/Throat: Mucous membranes are moist.   Neck: No stridor.   Cardiovascular: Normal rate, regular rhythm. Grossly normal heart sounds.  Good peripheral circulation. Respiratory: Normal respiratory effort.  No retractions. Lungs bilateral expiratory wheezes are noted throughout.  Patient is able to talk in complete sentences without any difficulty or pausing. Gastrointestinal: Soft and nontender. No distention. No CVA tenderness. Musculoskeletal: Moves upper and lower extremities with any difficulty.  Normal gait was noted. Neurologic:  Normal speech and language. No gross focal neurologic deficits are appreciated.  Skin:  Skin is warm, dry and intact. No rash noted. Psychiatric: Mood and affect are normal. Speech and behavior are normal.  ____________________________________________   LABS (all labs ordered are listed, but only abnormal results are displayed)  Labs Reviewed  BASIC METABOLIC PANEL - Abnormal; Notable for the following components:      Result Value   Glucose, Bld 133 (*)    Calcium 8.6 (*)    All other components within normal limits  CBC - Abnormal; Notable for the following components:   Hemoglobin 15.3 (*)    All other components within normal limits  URINALYSIS, COMPLETE (UACMP) WITH MICROSCOPIC - Abnormal; Notable for the following components:   Color, Urine YELLOW (*)    APPearance HAZY (*)    Hgb urine dipstick MODERATE (*)    Protein, ur 30 (*)    Leukocytes,Ua LARGE (*)    WBC, UA >50 (*)    Bacteria, UA RARE (*)    All other components within normal limits  RESP PANEL BY RT-PCR (FLU A&B, COVID) ARPGX2  TROPONIN I (HIGH  SENSITIVITY)    RADIOLOGY I, 06/11/20, personally viewed and evaluated these images (plain radiographs) as part of my medical decision making, as well as reviewing the written report by the radiologist.   Official radiology report(s): DG Chest 2 View  Result Date: 06/09/2020 CLINICAL DATA:  Shortness of breath, cough, and congestion EXAM: CHEST - 2 VIEW COMPARISON:  08/08/2019 FINDINGS: The heart size and mediastinal contours are within normal limits. Both lungs are clear. The visualized skeletal structures are unremarkable. IMPRESSION: No active cardiopulmonary disease. Electronically Signed   By: 08/10/2019 M.D.   On: 06/09/2020 05:51    ____________________________________________   PROCEDURES  Procedure(s) performed (including Critical Care):  Procedures   ____________________________________________   INITIAL IMPRESSION / ASSESSMENT AND PLAN / ED COURSE  As part of my medical decision making, I reviewed the following data within the electronic MEDICAL RECORD NUMBER Notes from prior ED visits and [place] Controlled Substance Database    Clinical Course as of Jun 10 1051  Sun Jun 09, 2020  0954 Resp Panel by RT-PCR (Flu A&B, Covid) Nasopharyngeal Swab [RS]    Clinical Course User Index [RS] Tommi Rumps, PA-C   49 year old female presents to the ED via EMS with complaint of shortness of breath, productive cough and wheezing.  Patient has history of COPD and asthma.  She was given prednisone and a DuoNeb treatment while waiting to be seen.  She has improved somewhat.  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  Chest exam is consistent with asthma with bilateral expiratory wheezes.  Chest x-ray was reported negative per radiologist.  Patient was reassured and another nebulizer treatment was given to her prior to discharge which also improved her breathing.  Patient was made aware that  she does have a urinary tract infection.  A culture was ordered and patient was placed on Keflex 500 mg 3 times daily for 10 days.  She is to continue with her albuterol inhaler, prednisone starting tomorrow, Keflex and Robitussin AC as needed for her cough.  Patient was given a note stating that she can return to work .  ____________________________________________   FINAL CLINICAL IMPRESSION(S) / ED DIAGNOSES  Final diagnoses:  Moderate acute exacerbation of asthma  Acute urinary tract infection  Viral URI with cough  Encounter for screening for COVID-19     ED Discharge Orders         Ordered    albuterol (VENTOLIN HFA) 108 (90 Base) MCG/ACT inhaler  Every 6 hours PRN        06/09/20 0947    predniSONE (DELTASONE) 10 MG tablet        06/09/20 0947    cephALEXin (KEFLEX) 500 MG capsule  3 times daily        06/09/20 0947    guaiFENesin-codeine 100-10 MG/5ML syrup  Every 6 hours PRN        06/09/20 0093          *Please note:  Lisa Sexton was evaluated in Emergency Department on 06/09/2020 for the symptoms described in the history of present illness. She was evaluated in the context of the global COVID-19 pandemic, which necessitated consideration that the patient might be at risk for infection with the SARS-CoV-2 virus that causes COVID-19. Institutional protocols and algorithms that pertain to the evaluation of patients at risk for COVID-19 are in a state of rapid change based on information released by regulatory bodies including the CDC and federal and state organizations. These policies and algorithms were followed during the patient's care in the ED.  Some ED evaluations and interventions may be delayed as a result of limited staffing during and the pandemic.*   Note:  This document was prepared using Dragon voice recognition software and may include unintentional dictation errors.    Tommi Rumps, PA-C 06/09/20 1052    Chesley Noon, MD 06/10/20 [phone]

## 2020-08-04 ENCOUNTER — Encounter: Payer: Self-pay | Admitting: *Deleted

## 2020-08-04 ENCOUNTER — Other Ambulatory Visit: Payer: Self-pay

## 2020-08-04 DIAGNOSIS — M25552 Pain in left hip: Secondary | ICD-10-CM | POA: Insufficient documentation

## 2020-08-04 DIAGNOSIS — G8929 Other chronic pain: Secondary | ICD-10-CM | POA: Insufficient documentation

## 2020-08-04 DIAGNOSIS — J449 Chronic obstructive pulmonary disease, unspecified: Secondary | ICD-10-CM | POA: Insufficient documentation

## 2020-08-04 DIAGNOSIS — F17210 Nicotine dependence, cigarettes, uncomplicated: Secondary | ICD-10-CM | POA: Insufficient documentation

## 2020-08-04 DIAGNOSIS — M25551 Pain in right hip: Secondary | ICD-10-CM | POA: Insufficient documentation

## 2020-08-04 DIAGNOSIS — J45909 Unspecified asthma, uncomplicated: Secondary | ICD-10-CM | POA: Insufficient documentation

## 2020-08-04 NOTE — ED Notes (Signed)
Pt seen walking out of lobby. Pt did not get into a car and appears to just have moved to outside waiting.

## 2020-08-04 NOTE — ED Triage Notes (Signed)
Pt c/o chronic L hip pain. Pt has missed work today and needs a work note. Pt states she has called her orthopedic MD and needs steroid shots. :Pt states she has taken ibuprofen and tylenol w/o relief. Pt ambulatory w/ limp to stat registration desk.

## 2020-08-04 NOTE — ED Notes (Signed)
Pt seen entering ED lobby again.

## 2020-08-05 ENCOUNTER — Emergency Department
Admission: EM | Admit: 2020-08-05 | Discharge: 2020-08-05 | Disposition: A | Discharge status: Home / Self Care | Payer: Self-pay | Attending: Emergency Medicine | Admitting: Emergency Medicine

## 2020-08-05 DIAGNOSIS — G8929 Other chronic pain: Secondary | ICD-10-CM

## 2020-08-05 DIAGNOSIS — M25552 Pain in left hip: Secondary | ICD-10-CM

## 2020-08-05 MED ORDER — OXYCODONE-ACETAMINOPHEN 5-325 MG PO TABS
2.0000 | ORAL_TABLET | Freq: Four times a day (QID) | ORAL | 0 refills | Status: DC | PRN
Start: 2020-08-05 — End: 2020-10-09

## 2020-08-05 MED ORDER — IBUPROFEN 800 MG PO TABS: 800.0000 mg | ORAL_TABLET | Freq: Three times a day (TID) | ORAL | 0 refills | Status: DC | PRN

## 2020-08-05 MED ORDER — PREDNISONE 10 MG (21) PO TBPK: ORAL_TABLET | ORAL | 0 refills | Status: DC

## 2020-08-05 MED ORDER — OXYCODONE-ACETAMINOPHEN 5-325 MG PO TABS
2.0000 | ORAL_TABLET | Freq: Four times a day (QID) | ORAL | 0 refills | Status: DC | PRN
Start: 2020-08-05 — End: 2020-08-05

## 2020-08-05 NOTE — Discharge Instructions (Signed)

## 2020-08-05 NOTE — ED Notes (Signed)
Pt denies further questions at this time. Pt ambulatory at this time.

## 2020-08-05 NOTE — ED Provider Notes (Signed)
[facility] Emergency Department Provider Note  ____________________________________________   Event Date/Time   First MD Initiated Contact with Patient 08/05/20 0117     (approximate)  I have reviewed the triage vital signs and the nursing notes.   HISTORY  Chief Complaint Hip Pain    HPI Lisa Sexton is a 50 y.o. female with history of COPD who presents to the emergency department with bilateral hip pain for the past several days.  States that she was unable to perform a competency test at work today due to the pain and is requesting a work note.  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  No injury to her hips and states that this has been ongoing intermittently for her for 3 years.  She states she has been told that she has bursitis and is followed by [facility] orthopedics.  No fevers, redness or warmth.  Able to ambulate but does so with a limp.  She is requesting prednisone which she states has helped with her pain in the past.  No calf tenderness or calf swelling.        Past Medical History:  Diagnosis Date  . Asthma   . COPD (chronic obstructive pulmonary disease) (HCC)     There are no problems to display for this patient.   Past Surgical History:  Procedure Laterality Date  . ASD REPAIR    . CHOLECYSTECTOMY    . OVARIAN CYST REMOVAL      Prior to Admission medications   Medication Sig Start Date End Date Taking? Authorizing Provider  oxyCODONE-acetaminophen (PERCOCET) 5-325 MG tablet Take 2 tablets by mouth every 6 (six) hours as needed for severe pain. 08/05/20 08/05/21 Yes Johnsie Moscoso, Layla Maw, DO  predniSONE (STERAPRED UNI-PAK 21 TAB) 10 MG (21) TBPK tablet Take as directed 08/05/20  Yes Luman Holway N, DO  albuterol (VENTOLIN HFA) 108 (90 Base) MCG/ACT inhaler Inhale 2 puffs into the lungs every 6 (six) hours as needed for wheezing or shortness of breath. 06/09/20   Tommi Rumps, PA-C  cephALEXin (KEFLEX) 500 MG capsule Take 1 capsule  (500 mg total) by mouth 3 (three) times daily. 06/09/20   Tommi Rumps, PA-C  chlorpheniramine-HYDROcodone (TUSSIONEX PENNKINETIC ER) 10-8 MG/5ML SUER Take 5 mLs by mouth every 12 (twelve) hours as needed for cough. 06/09/20   Tommi Rumps, PA-C  doxycycline (MONODOX) 100 MG capsule Take 100 mg by mouth 2 (two) times daily.    [provider]  ibuprofen (ADVIL) 800 MG tablet Take 1 tablet (800 mg total) by mouth every 8 (eight) hours as needed for mild pain. 08/05/20   Kian Gamarra, Layla Maw, DO  potassium chloride (KLOR-CON) 10 MEQ tablet Take 1 tablet (10 mEq total) by mouth daily. 03/18/20 06/09/20  Enid Derry, PA-C    Allergies Patient has no known allergies.  History reviewed. No pertinent family history.  Social History Social History   Tobacco Use  . Smoking status: Current Every Day Smoker    Packs/day: 1.00    Types: Cigarettes    Last attempt to quit: 05/02/2018    Years since quitting: 2.2  . Smokeless tobacco: Never Used  Vaping Use  . Vaping Use: Never used  Substance Use Topics  . Alcohol use: Yes    Comment: rarely  . Drug use: Never    Review of Systems Constitutional: No fever. Eyes: No visual changes. ENT: No sore throat. Cardiovascular: Denies chest pain. Respiratory: Denies shortness of breath. Gastrointestinal: No  nausea, vomiting, diarrhea. Genitourinary: Negative for dysuria. Musculoskeletal: Negative for back pain. Skin: Negative for rash. Neurological: Negative for focal weakness or numbness.  ____________________________________________   PHYSICAL EXAM:  VITAL SIGNS: ED Triage Vitals  Enc Vitals Group     BP 08/04/20 2057 108/65     Pulse Rate 08/04/20 2057 (!) 105     Resp 08/04/20 2057 20     Temp 08/04/20 2057 98.2 F (36.8 C)     Temp Source 08/04/20 2057 Oral     SpO2 08/04/20 2057 95 %     Weight 08/04/20 2059 196 lb (88.9 kg)     Height 08/04/20 2059 5\' 1"  (1.549 m)     Head Circumference --      Peak Flow --       Pain Score 08/04/20 2058 7     Pain Loc --      Pain Edu? --      Excl. in GC? --    CONSTITUTIONAL: Alert and oriented and responds appropriately to questions. Well-appearing; well-nourished HEAD: Normocephalic EYES: Conjunctivae clear, pupils appear equal, EOM appear intact ENT: normal nose; moist mucous membranes NECK: Supple, normal ROM CARD: RRR; S1 and S2 appreciated; no murmurs, no clicks, no rubs, no gallops RESP: Normal chest excursion without splinting or tachypnea; breath sounds clear and equal bilaterally; no wheezes, no rhonchi, no rales, no hypoxia or respiratory distress, speaking full sentences ABD/GI: Normal bowel sounds; non-distended; soft, non-tender, no rebound, no guarding, no peritoneal signs, no hepatosplenomegaly BACK: The back appears normal EXT: Normal ROM in all joints; no deformity noted, no edema; no cyanosis, tender over her lateral hips bilaterally without redness, warmth, soft tissue swelling.  Full range of motion in both joints.  She does ambulate with an antalgic gait but is able to do so without assistance.  No calf tenderness or calf swelling.  No edema noted.  2+ DP pulses bilaterally.  Compartments in bilateral lower extremities are soft. SKIN: Normal color for age and race; warm; no rash on exposed skin NEURO: Moves all extremities equally PSYCH: The patient's mood and manner are appropriate.  ____________________________________________   LABS (all labs ordered are listed, but only abnormal results are displayed)  Labs Reviewed - No data to display ____________________________________________  EKG  None ____________________________________________  RADIOLOGY I, Janeya Deyo, personally viewed and evaluated these images (plain radiographs) as part of my medical decision making, as well as reviewing the written report by the radiologist.  ED MD interpretation: None  Official radiology report(s): No results  found.  ____________________________________________   PROCEDURES  Procedure(s) performed (including Critical Care):  Procedures   ____________________________________________   INITIAL IMPRESSION / ASSESSMENT AND PLAN / ED COURSE  As part of my medical decision making, I reviewed the following data within the electronic MEDICAL RECORD NUMBER Nursing notes reviewed and incorporated, Old chart reviewed, Notes from prior ED visits and  Controlled Substance Database         Patient here with acute exacerbation of chronic bilateral hip pain.  She is requesting prednisone which she states has helped her before.  She states she will schedule an appointment with her orthopedist.  Denies any new injury to suggest fracture.  No signs of septic arthritis, gout on exam.  Neurovascularly intact distally.  Will discharge on prednisone taper.  She is not a diabetic.  She is also requesting something for pain.  We will give her a brief prescription of Percocet.  She declines anything here in the emergency  department.  Will provide with work note as well.  At this time, I do not feel there is any life-threatening condition present. I have reviewed, interpreted and discussed all results (EKG, imaging, lab, urine as appropriate) and exam findings with patient/family. I have reviewed nursing notes and appropriate previous records.  I feel the patient is safe to be discharged home without further emergent workup and can continue workup as an outpatient as needed. Discussed usual and customary return precautions. Patient/family verbalize understanding and are comfortable with this plan.  Outpatient follow-up has been provided as needed. All questions have been answered.       ____________________________________________   FINAL CLINICAL IMPRESSION(S) / ED DIAGNOSES  Final diagnoses:  Chronic hip pain, bilateral     ED Discharge Orders         Ordered    predniSONE (STERAPRED UNI-PAK 21 TAB) 10 MG  (21) TBPK tablet        08/05/20 0138    oxyCODONE-acetaminophen (PERCOCET) 5-325 MG tablet  Every 6 hours PRN        08/05/20 0138    ibuprofen (ADVIL) 800 MG tablet  Every 8 hours PRN,   Status:  Discontinued        08/05/20 0138    ibuprofen (ADVIL) 800 MG tablet  Every 8 hours PRN        08/05/20 0138          *Please note:  Lisa Sexton was evaluated in Emergency Department on 08/05/2020 for the symptoms described in the history of present illness. She was evaluated in the context of the global COVID-19 pandemic, which necessitated consideration that the patient might be at risk for infection with the SARS-CoV-2 virus that causes COVID-19. Institutional protocols and algorithms that pertain to the evaluation of patients at risk for COVID-19 are in a state of rapid change based on information released by regulatory bodies including the CDC and federal and state organizations. These policies and algorithms were followed during the patient's care in the ED.  Some ED evaluations and interventions may be delayed as a result of limited staffing during and the pandemic.*   Note:  This document was prepared using Dragon voice recognition software and may include unintentional dictation errors.   Kaleb Linquist, Layla Maw, DO 08/05/20 [phone]

## 2020-10-09 ENCOUNTER — Emergency Department
Admission: EM | Admit: 2020-10-09 | Discharge: 2020-10-09 | Disposition: A | Discharge status: Home / Self Care | Payer: Self-pay | Attending: Emergency Medicine | Admitting: Emergency Medicine

## 2020-10-09 ENCOUNTER — Encounter: Payer: Self-pay | Admitting: Emergency Medicine

## 2020-10-09 ENCOUNTER — Other Ambulatory Visit: Payer: Self-pay

## 2020-10-09 DIAGNOSIS — L02511 Cutaneous abscess of right hand: Secondary | ICD-10-CM | POA: Insufficient documentation

## 2020-10-09 DIAGNOSIS — J45909 Unspecified asthma, uncomplicated: Secondary | ICD-10-CM | POA: Insufficient documentation

## 2020-10-09 DIAGNOSIS — J449 Chronic obstructive pulmonary disease, unspecified: Secondary | ICD-10-CM | POA: Insufficient documentation

## 2020-10-09 DIAGNOSIS — F17210 Nicotine dependence, cigarettes, uncomplicated: Secondary | ICD-10-CM | POA: Insufficient documentation

## 2020-10-09 DIAGNOSIS — L0291 Cutaneous abscess, unspecified: Secondary | ICD-10-CM

## 2020-10-09 DIAGNOSIS — R110 Nausea: Secondary | ICD-10-CM | POA: Insufficient documentation

## 2020-10-09 MED ORDER — SULFAMETHOXAZOLE-TRIMETHOPRIM 800-160 MG PO TABS
1.0000 | ORAL_TABLET | Freq: Once | ORAL | Status: AC
  Administered 2020-10-09: 1 via ORAL
  Filled 2020-10-09: qty 1

## 2020-10-09 MED ORDER — CEPHALEXIN 500 MG PO CAPS
500.0000 mg | ORAL_CAPSULE | Freq: Four times a day (QID) | ORAL | 0 refills | Status: AC
Start: 2020-10-09 — End: 2020-10-19

## 2020-10-09 MED ORDER — IBUPROFEN 600 MG PO TABS
600.0000 mg | ORAL_TABLET | Freq: Once | ORAL | Status: AC
  Administered 2020-10-09: 600 mg via ORAL
  Filled 2020-10-09: qty 1

## 2020-10-09 MED ORDER — HYDROCODONE-ACETAMINOPHEN 5-325 MG PO TABS
1.0000 | ORAL_TABLET | Freq: Once | ORAL | Status: AC
  Administered 2020-10-09: 1 via ORAL
  Filled 2020-10-09: qty 1

## 2020-10-09 MED ORDER — CEPHALEXIN 500 MG PO CAPS
1000.0000 mg | ORAL_CAPSULE | Freq: Once | ORAL | Status: AC
  Administered 2020-10-09: 1000 mg via ORAL
  Filled 2020-10-09: qty 2

## 2020-10-09 MED ORDER — SULFAMETHOXAZOLE-TRIMETHOPRIM 800-160 MG PO TABS: 1.0000 | ORAL_TABLET | Freq: Two times a day (BID) | ORAL | 0 refills | Status: AC

## 2020-10-09 MED ORDER — HYDROCODONE-ACETAMINOPHEN 5-325 MG PO TABS: 1.0000 | ORAL_TABLET | ORAL | 0 refills | Status: DC | PRN

## 2020-10-09 NOTE — ED Provider Notes (Incomplete)
[facility] Emergency Department Provider Note  ___________________________________________   Event Date/Time   First MD Initiated Contact with Patient 10/09/20 1330     (approximate)  I have reviewed the triage vital signs and the nursing notes.   HISTORY  Chief Complaint Abscess  HPI Lisa Sexton is a 50 y.o. female who presents to the emergency department for evaluation of abscess to the dorsal side of the right hand.  Patient states that it showed up a few days ago.  She has history of similar with prior staph infection were treated in the right axillary region.  She states she had to be switched back and forth to several different antibiotics to get that infection to clear.  She had been doing well for several months before this occurred a few days ago.  She reports no fevers, chills.  She does endorse nausea with episodes of worsening of the pain, but states that when she gets the pain under control in her hand she is no longer nauseous.  She denies any vomiting, dysuria, abdominal pain or other systemic symptoms.       Past Medical History:  Diagnosis Date  . Asthma   . COPD (chronic obstructive pulmonary disease) (HCC)     There are no problems to display for this patient.   Past Surgical History:  Procedure Laterality Date  . ASD REPAIR    . CHOLECYSTECTOMY    . OVARIAN CYST REMOVAL      Prior to Admission medications   Medication Sig Start Date End Date Taking? Authorizing Provider  albuterol (VENTOLIN HFA) 108 (90 Base) MCG/ACT inhaler Inhale 2 puffs into the lungs every 6 (six) hours as needed for wheezing or shortness of breath. 06/09/20   Tommi Rumps, PA-C  cephALEXin (KEFLEX) 500 MG capsule Take 1 capsule (500 mg total) by mouth 3 (three) times daily. 06/09/20   Tommi Rumps, PA-C  chlorpheniramine-HYDROcodone (TUSSIONEX PENNKINETIC ER) 10-8 MG/5ML SUER Take 5 mLs by mouth every 12 (twelve) hours as needed for cough.  06/09/20   Tommi Rumps, PA-C  doxycycline (MONODOX) 100 MG capsule Take 100 mg by mouth 2 (two) times daily.    [provider]  ibuprofen (ADVIL) 800 MG tablet Take 1 tablet (800 mg total) by mouth every 8 (eight) hours as needed for mild pain. 08/05/20   Ward, Layla Maw, DO  oxyCODONE-acetaminophen (PERCOCET) 5-325 MG tablet Take 2 tablets by mouth every 6 (six) hours as needed for severe pain. 08/05/20 08/05/21  Ward, Layla Maw, DO  predniSONE (STERAPRED UNI-PAK 21 TAB) 10 MG (21) TBPK tablet Take as directed 08/05/20   Ward, Layla Maw, DO  potassium chloride (KLOR-CON) 10 MEQ tablet Take 1 tablet (10 mEq total) by mouth daily. 03/18/20 06/09/20  Enid Derry, PA-C    Allergies Patient has no known allergies.  History reviewed. No pertinent family history.  Social History Social History   Tobacco Use  . Smoking status: Current Every Day Smoker    Packs/day: 1.00    Types: Cigarettes    Last attempt to quit: 05/02/2018    Years since quitting: 2.4  . Smokeless tobacco: Never Used  Vaping Use  . Vaping Use: Never used  Substance Use Topics  . Alcohol use: Yes    Comment: rarely  . Drug use: Never    Review of Systems Constitutional: No fever/chills Eyes: No visual changes. ENT: No sore throat. Cardiovascular: Denies chest pain. Respiratory: Denies shortness of breath. Gastrointestinal:  No abdominal pain.  No nausea, no vomiting.  No diarrhea.  No constipation. Genitourinary: Negative for dysuria. Musculoskeletal: + Right hand pain, negative for back pain. Skin: + Right hand abscess, negative for rash. Neurological: Negative for headaches, focal weakness or numbness.  ____________________________________________   PHYSICAL EXAM:  VITAL SIGNS: ED Triage Vitals  Enc Vitals Group     BP 10/09/20 1245 (!) 151/109     Pulse Rate 10/09/20 1245 88     Resp 10/09/20 1245 20     Temp 10/09/20 1245 (!) 97.5 F (36.4 C)     Temp Source 10/09/20 1245 Oral      SpO2 10/09/20 1245 99 %     Weight 10/09/20 1246 196 lb (88.9 kg)     Height 10/09/20 1246 5\' 1"  (1.549 m)     Head Circumference --      Peak Flow --      Pain Score 10/09/20 1245 8     Pain Loc --      Pain Edu? --      Excl. in GC? --    Constitutional: Alert and oriented. Well appearing and in no acute distress. Eyes: Conjunctivae are normal. PERRL. EOMI. Head: Atraumatic. Nose: No congestion/rhinnorhea. Mouth/Throat: Mucous membranes are moist.  Neck: No stridor.   Cardiovascular: Normal rate, regular rhythm. Grossly normal heart sounds.  Respiratory: Normal respiratory effort.  No retractions.  Gastrointestinal: Soft and nontender. No distention. No abdominal bruits. No CVA tenderness. Musculoskeletal: There is a 1 cm x 1 cm raised erythematous lesion to the center of the dorsum of the right hand with induration but no fluctuance.  There is milder erythema present over the remainder of the dorsum of the right hand with mild soft tissue swelling.  This erythema and swelling does not cross over to the digits and does not cross the carpal bones into the forearm.  There is no active drainage from the wound. Neurologic:  Normal speech and language. No gross focal neurologic deficits are appreciated. No gait instability. Skin:  Skin is warm, dry and intact. No rash noted. Psychiatric: Mood and affect are normal. Speech and behavior are normal.  ____________________________________________   LABS (all labs ordered are listed, but only abnormal results are displayed)  Labs Reviewed - No data to display ____________________________________________  EKG  *** ____________________________________________  RADIOLOGY I, 10/11/20, personally viewed and evaluated these images (plain radiographs) as part of my medical decision making, as well as reviewing the written report by the radiologist.  ED MD interpretation:  ***  Official radiology report(s): No results  found.  ____________________________________________   PROCEDURES  Procedure(s) performed (including Critical Care):  Procedures   ____________________________________________   INITIAL IMPRESSION / ASSESSMENT AND PLAN / ED COURSE  As part of my medical decision making, I reviewed the following data within the electronic MEDICAL RECORD NUMBER {[id number]::"Notes from prior ED visits","[place] Controlled Substance Database"}        ***      ____________________________________________   FINAL CLINICAL IMPRESSION(S) / ED DIAGNOSES  Final diagnoses:  None     ED Discharge Orders    None      *Please note:  Lisa Sexton was evaluated in Emergency Department on 10/09/2020 for the symptoms described in the history of present illness. She was evaluated in the context of the global COVID-19 pandemic, which necessitated consideration that the patient might be at risk for infection with the SARS-CoV-2 virus that causes COVID-19. Institutional protocols and algorithms that  pertain to the evaluation of patients at risk for COVID-19 are in a state of rapid change based on information released by regulatory bodies including the CDC and federal and state organizations. These policies and algorithms were followed during the patient's care in the ED.  Some ED evaluations and interventions may be delayed as a result of limited staffing during and the pandemic.*   Note:  This document was prepared using Dragon voice recognition software and may include unintentional dictation errors.

## 2020-10-09 NOTE — ED Triage Notes (Signed)
Pt comes into the ED via POV c/o abscess to the posterior side of the right hand.  Pt in NAD at this time with even and unlabored respirations. Pt has h/o abscesses.

## 2020-10-09 NOTE — Discharge Instructions (Signed)
Please take antibiotics as prescribed. Follow up with primary care if not improving or return to ER for any worsening.

## 2020-10-10 NOTE — ED Provider Notes (Signed)
[facility] Emergency Department Provider Note  ___________________________________________   Event Date/Time   First MD Initiated Contact with Patient 10/09/20 1330     (approximate)  I have reviewed the triage vital signs and the nursing notes.   HISTORY  Chief Complaint Abscess  HPI Lisa Sexton is a 50 y.o. female who presents to the emergency department for evaluation of abscess to the dorsal side of the right hand.  Patient states that it showed up a few days ago.  She has history of similar with prior staph infection were treated in the right axillary region.  She states she had to be switched back and forth to several different antibiotics to get that infection to clear.  She had been doing well for several months before this occurred a few days ago.  She reports no fevers, chills.  She does endorse nausea with episodes of worsening of the pain, but states that when she gets the pain under control in her hand she is no longer nauseous.  She denies any vomiting, dysuria, abdominal pain or other systemic symptoms.       Past Medical History:  Diagnosis Date  . Asthma   . COPD (chronic obstructive pulmonary disease) (HCC)     There are no problems to display for this patient.   Past Surgical History:  Procedure Laterality Date  . ASD REPAIR    . CHOLECYSTECTOMY    . OVARIAN CYST REMOVAL      Prior to Admission medications   Medication Sig Start Date End Date Taking? Authorizing Provider  cephALEXin (KEFLEX) 500 MG capsule Take 1 capsule (500 mg total) by mouth 4 (four) times daily for 10 days. 10/09/20 10/19/20 Yes Daegon Deiss, Ruben Gottron, PA  HYDROcodone-acetaminophen (NORCO) 5-325 MG tablet Take 1 tablet by mouth every 4 (four) hours as needed for moderate pain. 10/09/20  Yes Lucy Chris, PA  sulfamethoxazole-trimethoprim (BACTRIM DS) 800-160 MG tablet Take 1 tablet by mouth 2 (two) times daily for 10 days. 10/09/20 10/19/20 Yes Larrissa Stivers,  Ruben Gottron, PA  albuterol (VENTOLIN HFA) 108 (90 Base) MCG/ACT inhaler Inhale 2 puffs into the lungs every 6 (six) hours as needed for wheezing or shortness of breath. 06/09/20   Tommi Rumps, PA-C  ibuprofen (ADVIL) 800 MG tablet Take 1 tablet (800 mg total) by mouth every 8 (eight) hours as needed for mild pain. 08/05/20   Ward, Layla Maw, DO  predniSONE (STERAPRED UNI-PAK 21 TAB) 10 MG (21) TBPK tablet Take as directed 08/05/20   Ward, Layla Maw, DO  potassium chloride (KLOR-CON) 10 MEQ tablet Take 1 tablet (10 mEq total) by mouth daily. 03/18/20 06/09/20  Enid Derry, PA-C    Allergies Patient has no known allergies.  History reviewed. No pertinent family history.  Social History Social History   Tobacco Use  . Smoking status: Current Every Day Smoker    Packs/day: 1.00    Types: Cigarettes    Last attempt to quit: 05/02/2018    Years since quitting: 2.4  . Smokeless tobacco: Never Used  Vaping Use  . Vaping Use: Never used  Substance Use Topics  . Alcohol use: Yes    Comment: rarely  . Drug use: Never    Review of Systems Constitutional: No fever/chills Eyes: No visual changes. ENT: No sore throat. Cardiovascular: Denies chest pain. Respiratory: Denies shortness of breath. Gastrointestinal: No abdominal pain.  No nausea, no vomiting.  No diarrhea.  No constipation. Genitourinary: Negative for dysuria. Musculoskeletal: +  Right hand pain, negative for back pain. Skin: + Right hand abscess, negative for rash. Neurological: Negative for headaches, focal weakness or numbness.  ____________________________________________   PHYSICAL EXAM:  VITAL SIGNS: ED Triage Vitals  Enc Vitals Group     BP 10/09/20 1245 (!) 151/109     Pulse Rate 10/09/20 1245 88     Resp 10/09/20 1245 20     Temp 10/09/20 1245 (!) 97.5 F (36.4 C)     Temp Source 10/09/20 1245 Oral     SpO2 10/09/20 1245 99 %     Weight 10/09/20 1246 196 lb (88.9 kg)     Height 10/09/20 1246 5\' 1"   (1.549 m)     Head Circumference --      Peak Flow --      Pain Score 10/09/20 1245 8     Pain Loc --      Pain Edu? --      Excl. in GC? --    Constitutional: Alert and oriented. Well appearing and in no acute distress. Eyes: Conjunctivae are normal. PERRL. EOMI. Head: Atraumatic. Nose: No congestion/rhinnorhea. Mouth/Throat: Mucous membranes are moist.  Neck: No stridor.   Cardiovascular: Normal rate, regular rhythm. Grossly normal heart sounds.  Respiratory: Normal respiratory effort.  No retractions.  Gastrointestinal: Soft and nontender. No distention. No abdominal bruits. No CVA tenderness. Musculoskeletal: There is a 1 cm x 1 cm raised erythematous lesion to the center of the dorsum of the right hand with induration but no fluctuance.  There is milder erythema present over the remainder of the dorsum of the right hand with mild soft tissue swelling.  This erythema and swelling does not cross over to the digits and does not cross the carpal bones into the forearm.  There is no active drainage from the wound. Neurologic:  Normal speech and language. No gross focal neurologic deficits are appreciated. No gait instability. Skin:  Skin is warm, dry and intact except as described above. No rash noted. Psychiatric: Mood and affect are normal. Speech and behavior are normal.   ____________________________________________   INITIAL IMPRESSION / ASSESSMENT AND PLAN / ED COURSE  As part of my medical decision making, I reviewed the following data within the electronic MEDICAL RECORD NUMBER Nursing notes reviewed and incorporated and Notes from prior ED visits        Patient is a 50 year old female who presents to the emergency department for evaluation of a lesion to the dorsal side of the right hand with surrounding redness, see HPI for further details.  In triage, the patient is mildly hypertensive, but is afebrile, not tachycardic.  Patient does have a 1 cm x 1 cm raised erythematous  lesion that is well indurated, does not feel fluctuant.  There is surrounding mild erythema over the dorsum of the hand, not extending into the digits or beyond the carpal bones.  Suspect that this most likely represents small abscess with surrounding cellulitis in the setting of a patient with significant history of other abscesses.  Chart review reveals that patient had similar in the right axillary region approximately 6 months ago and had to be put on multiple different antibiotics to get full clearance of the lesion.  At this time, the patient is afebrile, not septic appearing.  Given the induration and lack of fluctuance, I do not feel that an I&D at this time would be very successful, and the patient reports I&D attempted on her previous ones did not yield significant drainage either.  Will initiate oral antibiotics with double coverage with Keflex and Bactrim.  Return precautions were discussed at length with the patient and she will return if she has any worsening.  Otherwise, she will follow-up with her dermatologist at [facility] who had a role in the previous management of her other skin infections.  Patient is amenable with this plan, stable at this time for outpatient follow-up.      ____________________________________________   FINAL CLINICAL IMPRESSION(S) / ED DIAGNOSES  Final diagnoses:  Abscess     ED Discharge Orders         Ordered    sulfamethoxazole-trimethoprim (BACTRIM DS) 800-160 MG tablet  2 times daily        10/09/20 1350    cephALEXin (KEFLEX) 500 MG capsule  4 times daily        10/09/20 1350    HYDROcodone-acetaminophen (NORCO) 5-325 MG tablet  Every 4 hours PRN        10/09/20 1350          *Please note:  LEVEDA KENDRIX was evaluated in Emergency Department on 10/10/2020 for the symptoms described in the history of present illness. She was evaluated in the context of the global COVID-19 pandemic, which necessitated consideration that the patient might be at  risk for infection with the SARS-CoV-2 virus that causes COVID-19. Institutional protocols and algorithms that pertain to the evaluation of patients at risk for COVID-19 are in a state of rapid change based on information released by regulatory bodies including the CDC and federal and state organizations. These policies and algorithms were followed during the patient's care in the ED.  Some ED evaluations and interventions may be delayed as a result of limited staffing during and the pandemic.*   Note:  This document was prepared using Dragon voice recognition software and may include unintentional dictation errors.   Lucy Chris, PA 10/10/20 0715    Concha Se, MD 10/10/20 [id number]

## 2021-01-20 ENCOUNTER — Other Ambulatory Visit: Payer: Self-pay

## 2021-10-12 ENCOUNTER — Emergency Department
Admission: EM | Admit: 2021-10-12 | Discharge: 2021-10-12 | Disposition: A | Discharge status: Home / Self Care | Payer: Self-pay | Attending: Emergency Medicine | Admitting: Emergency Medicine

## 2021-10-12 ENCOUNTER — Encounter: Payer: Self-pay | Admitting: Intensive Care

## 2021-10-12 ENCOUNTER — Emergency Department: Payer: Self-pay

## 2021-10-12 ENCOUNTER — Other Ambulatory Visit: Payer: Self-pay

## 2021-10-12 DIAGNOSIS — W19XXXA Unspecified fall, initial encounter: Secondary | ICD-10-CM | POA: Insufficient documentation

## 2021-10-12 DIAGNOSIS — J449 Chronic obstructive pulmonary disease, unspecified: Secondary | ICD-10-CM | POA: Insufficient documentation

## 2021-10-12 DIAGNOSIS — M5432 Sciatica, left side: Secondary | ICD-10-CM | POA: Insufficient documentation

## 2021-10-12 DIAGNOSIS — J45909 Unspecified asthma, uncomplicated: Secondary | ICD-10-CM | POA: Insufficient documentation

## 2021-10-12 HISTORY — DX: Essential (primary) hypertension: I10

## 2021-10-12 LAB — CBC WITH DIFFERENTIAL/PLATELET
Abs Immature Granulocytes: 0.03 10*3/uL (ref 0.00–0.07)
Basophils Absolute: 0 10*3/uL (ref 0.0–0.1)
Basophils Relative: 0 %
Eosinophils Absolute: 0 10*3/uL (ref 0.0–0.5)
Eosinophils Relative: 0 %
HCT: 44.7 % (ref 36.0–46.0)
Hemoglobin: 15.1 g/dL — ABNORMAL HIGH (ref 12.0–15.0)
Immature Granulocytes: 0 %
Lymphocytes Relative: 39 %
Lymphs Abs: 3.7 10*3/uL (ref 0.7–4.0)
MCH: 29.8 pg (ref 26.0–34.0)
MCHC: 33.8 g/dL (ref 30.0–36.0)
MCV: 88.3 fL (ref 80.0–100.0)
Monocytes Absolute: 0.5 10*3/uL (ref 0.1–1.0)
Monocytes Relative: 5 %
Neutro Abs: 5.1 10*3/uL (ref 1.7–7.7)
Neutrophils Relative %: 56 %
Platelets: 265 10*3/uL (ref 150–400)
RBC: 5.06 MIL/uL (ref 3.87–5.11)
RDW: 13 % (ref 11.5–15.5)
WBC: 9.3 10*3/uL (ref 4.0–10.5)
nRBC: 0 % (ref 0.0–0.2)

## 2021-10-12 LAB — COMPREHENSIVE METABOLIC PANEL
ALT: 27 U/L (ref 0–44)
AST: 27 U/L (ref 15–41)
Albumin: 3.8 g/dL (ref 3.5–5.0)
Alkaline Phosphatase: 81 U/L (ref 38–126)
Anion gap: 10 (ref 5–15)
BUN: 17 mg/dL (ref 6–20)
CO2: 25 mmol/L (ref 22–32)
Calcium: 8.8 mg/dL — ABNORMAL LOW (ref 8.9–10.3)
Chloride: 105 mmol/L (ref 98–111)
Creatinine, Ser: 0.83 mg/dL (ref 0.44–1.00)
GFR, Estimated: >60 mL/min (ref >60)
Glucose, Bld: 240 mg/dL — ABNORMAL HIGH (ref 70–99)
Potassium: 2.7 mmol/L — CL (ref 3.5–5.1)
Sodium: 140 mmol/L (ref 135–145)
Total Bilirubin: 0.8 mg/dL (ref 0.3–1.2)
Total Protein: 7.2 g/dL (ref 6.5–8.1)

## 2021-10-12 MED ORDER — PREDNISONE 10 MG (21) PO TBPK: ORAL_TABLET | ORAL | 0 refills | Status: DC

## 2021-10-12 MED ORDER — GABAPENTIN 300 MG PO CAPS: 300.0000 mg | ORAL_CAPSULE | Freq: Three times a day (TID) | ORAL | 0 refills | Status: AC | PRN

## 2021-10-12 MED ORDER — POTASSIUM CHLORIDE CRYS ER 20 MEQ PO TBCR
40.0000 meq | EXTENDED_RELEASE_TABLET | Freq: Once | ORAL | Status: AC
  Administered 2021-10-12: 40 meq via ORAL
  Filled 2021-10-12: qty 2

## 2021-10-12 NOTE — ED Notes (Signed)
Patient transported to X-ray 

## 2021-10-12 NOTE — ED Notes (Signed)
Patient to xray at this time

## 2021-10-12 NOTE — ED Provider Notes (Signed)
? ?[facility] ?Provider Note ? ? ? Event Date/Time  ? First MD Initiated Contact with Patient 10/12/21 1500   ?  (approximate) ? ? ?History  ? ?Shortness of Breath and Fall ? ? ?HPI ? ?EMMALINE Sexton is a 51 y.o. female  who, per outside hospital note dated 09/06/21 has history of COPD, GERD, who presents to the emergency department today because of concern for lower back and left leg pain. The patient states that she has been told she has sciatica in the past.  Typically however it does not last for more than a day.  2 days ago  she did start having pain in her left lower back that radiated down her left leg.  It did cause her to fall.  When she fell she landed on her tailbone and felt that her lower back scrunched up.  She has had pain in her lower back since then.  Initially patient does have history of asthma and occasionally has some wheezing.  She has noticed this is worse recently and thinks that it might be due to the pollen. ? ? ?Physical Exam  ? ?Triage Vital Signs: ?ED Triage Vitals  ?Enc Vitals Group  ?   BP 10/12/21 1427 (!) 151/92  ?   Pulse Rate 10/12/21 1427 84  ?   Resp 10/12/21 1427 (!) 24  ?   Temp 10/12/21 1427 (!) 97.5 ?F (36.4 ?C)  ?   Temp Source 10/12/21 1427 Oral  ?   SpO2 10/12/21 1427 94 %  ?   Weight 10/12/21 1428 205 lb (93 kg)  ?   Height 10/12/21 1428 5\' 1"  (1.549 m)  ?   Head Circumference --   ?   Peak Flow --   ?   Pain Score 10/12/21 1427 6  ? ?Most recent vital signs: ?Vitals:  ? 10/12/21 1427  ?BP: (!) 151/92  ?Pulse: 84  ?Resp: (!) 24  ?Temp: (!) 97.5 ?F (36.4 ?C)  ?SpO2: 94%  ? ? ?General: Awake, no distress.  ?CV:  Good peripheral perfusion. Regular rate and rhythm ?Resp:  Normal effort. Wheezing ?Abd:  No distention. Non tender ?MSK:  Tender to palpation to mid and upper lumbar spine. ? ? ?ED Results / Procedures / Treatments  ? ?Labs ?(all labs ordered are listed, but only abnormal results are displayed) ?Labs Reviewed  ?CBC WITH  DIFFERENTIAL/PLATELET - Abnormal; Notable for the following components:  ?    Result Value  ? Hemoglobin 15.1 (*)   ? All other components within normal limits  ?COMPREHENSIVE METABOLIC PANEL - Abnormal; Notable for the following components:  ? Potassium 2.7 (*)   ? Glucose, Bld 240 (*)   ? Calcium 8.8 (*)   ? All other components within normal limits  ? ? ? ?EKG ? ?I06/02/23, attending physician, personally viewed and interpreted this EKG ? ?EKG Time: 1430 ?Rate: 83 ?Rhythm: normal sinus rhythm ?Axis: normal ?Intervals: qtc 462 ?QRS: narrow, q waves v1 ?ST changes: no st elevation ?Impression: abnormal ekg ? ?RADIOLOGY ?I independently interpreted and visualized the CXR. My interpretation: No pneumonia. No pneumothorax. ?Radiology interpretation:  ?IMPRESSION:  ?No active cardiopulmonary disease.  ? ? ? ?PROCEDURES: ? ?Critical Care performed: No ? ?Procedures ? ? ?MEDICATIONS ORDERED IN ED: ?Medications - No data to display ? ? ?IMPRESSION / MDM / ASSESSMENT AND PLAN / ED COURSE  ?I reviewed the triage vital signs and the nursing notes. ?             ?               ? ?  Differential diagnosis includes, but is not limited to, sciatica degenerative disc disease compression fracture. ? ?Patient presented to the emergency department today with primary concern for lower back pain that radiates down her left leg.  Did obtain a lumbar spine x-ray which did not show any acute fracture.  This time I think sciatica likely.  She also did complain of some very mild shortness of breath.  Chest without any pneumonia or pneumothorax.  No anemia or concerning leukocytosis.  We will plan on giving patient a course of steroids which should hopefully help with both sciatica and breathing difficulties.  We will also give patient prescription for gabapentin ? ?FINAL CLINICAL IMPRESSION(S) / ED DIAGNOSES  ? ?Final diagnoses:  ?Sciatica of left side  ? ? ? ? ?Note:  This document was prepared using Dragon voice recognition  software and may include unintentional dictation errors. ? ?  ?Phineas Semen, MD ?10/12/21 1646 ? ?

## 2021-10-12 NOTE — ED Triage Notes (Signed)
Patient c/o sob the past couple of days. Patient reports having mechanical fall Friday. Before fall patient was experiencing left lower back pain that radiates down leg. Hx sciatica, asthma, and copd ?

## 2021-10-12 NOTE — ED Notes (Signed)
Critical Result: Potassium 2.7 ? ?Derrill Kay, MD aware ?

## 2021-10-12 NOTE — Discharge Instructions (Signed)
Please seek medical attention for any high fevers, chest pain, shortness of breath, change in behavior, persistent vomiting, bloody stool or any other new or concerning symptoms.  

## 2022-01-29 ENCOUNTER — Other Ambulatory Visit: Payer: Self-pay

## 2023-01-29 ENCOUNTER — Emergency Department
Admission: EM | Admit: 2023-01-29 | Discharge: 2023-01-29 | Disposition: A | Discharge status: Home / Self Care | Payer: Self-pay | Attending: Emergency Medicine | Admitting: Emergency Medicine

## 2023-01-29 ENCOUNTER — Other Ambulatory Visit: Payer: Self-pay

## 2023-01-29 ENCOUNTER — Encounter: Payer: Self-pay | Admitting: Emergency Medicine

## 2023-01-29 DIAGNOSIS — K047 Periapical abscess without sinus: Secondary | ICD-10-CM | POA: Insufficient documentation

## 2023-01-29 DIAGNOSIS — K029 Dental caries, unspecified: Secondary | ICD-10-CM | POA: Insufficient documentation

## 2023-01-29 MED ORDER — CLINDAMYCIN HCL 300 MG PO CAPS: 300.0000 mg | ORAL_CAPSULE | Freq: Three times a day (TID) | ORAL | 0 refills | Status: AC

## 2023-01-29 MED ORDER — IBUPROFEN 800 MG PO TABS: 800.0000 mg | ORAL_TABLET | Freq: Three times a day (TID) | ORAL | 0 refills | Status: AC | PRN

## 2023-01-29 MED ORDER — LIDOCAINE VISCOUS HCL 2 % MT SOLN: 15.0000 mL | Freq: Four times a day (QID) | OROMUCOSAL | 0 refills | Status: AC | PRN

## 2023-01-29 MED ORDER — FLUCONAZOLE 150 MG PO TABS: 150.0000 mg | ORAL_TABLET | Freq: Every day | ORAL | 0 refills | Status: AC

## 2023-01-29 NOTE — Discharge Instructions (Signed)
Begin with medication sent to your pharmacy.  Contact dental clinics on your discharge papers for dental cafre

## 2023-01-29 NOTE — ED Triage Notes (Signed)
Pt in with co toothache states needs a root canal.

## 2023-01-29 NOTE — ED Provider Notes (Signed)
[facility] Provider Note    Event Date/Time   First MD Initiated Contact with Patient 01/29/23 1037     (approximate)   History   Dental Pain   HPI  Lisa Sexton is a 52 y.o. female   is her with dental pain and abscess.  Has been trying to save money for a root canal.  States that she had a lot of pus in her mouth this morning.        Physical Exam   Triage Vital Signs: ED Triage Vitals  Encounter Vitals Group     BP 01/29/23 0843 (!) 149/93     Systolic BP Percentile --      Diastolic BP Percentile --      Pulse Rate 01/29/23 0843 93     Resp 01/29/23 0843 18     Temp 01/29/23 0843 98.7 F (37.1 C)     Temp Source 01/29/23 0843 Oral     SpO2 01/29/23 0843 100 %     Weight 01/29/23 0842 205 lb (93 kg)     Height 01/29/23 0842 5\' 1"  (1.549 m)     Head Circumference --      Peak Flow --      Pain Score 01/29/23 0842 7     Pain Loc --      Pain Education --      Exclude from Growth Chart --     Most recent vital signs: Vitals:   01/29/23 0843  BP: (!) 149/93  Pulse: 93  Resp: 18  Temp: 98.7 F (37.1 C)  SpO2: 100%     General: Awake, no distress.  CV:  Good peripheral perfusion.  Resp:  Normal effort.  Abd:  No distention.  Other:  Multiple dental caries present with mild edema gums.  Tender to light touch.  No cervical lymph adenopathy.     ED Results / Procedures / Treatments   Labs (all labs ordered are listed, but only abnormal results are displayed) Labs Reviewed - No data to display     PROCEDURES:  Critical Care performed:   Procedures   MEDICATIONS ORDERED IN ED: Medications - No data to display   IMPRESSION / MDM / ASSESSMENT AND PLAN / ED COURSE  I reviewed the triage vital signs and the nursing notes.   Differential diagnosis includes, but is not limited to, dental pain, dental cary, dental abscess.     52 year old female here with dental complaints.  No recent antibiotics.  Patient was  given a list of dental clinics and cleocin, diflucan, ibuprofen and lidocaine viscous was sent to the pharmacy.      Patient's presentation is most consistent with acute, uncomplicated illness.  FINAL CLINICAL IMPRESSION(S) / ED DIAGNOSES   Final diagnoses:  Dental abscess     Rx / DC Orders   ED Discharge Orders          Ordered    clindamycin (CLEOCIN) 300 MG capsule  3 times daily        01/29/23 1043    fluconazole (DIFLUCAN) 150 MG tablet  Daily        01/29/23 1043    ibuprofen (ADVIL) 800 MG tablet  Every 8 hours PRN        01/29/23 1043    lidocaine (XYLOCAINE) 2 % solution  Every 6 hours PRN        01/29/23 1043  Note:  This document was prepared using Dragon voice recognition software and may include unintentional dictation errors.   Tommi Rumps, PA-C 01/29/23 1144    Sharman Cheek, MD 01/29/23 1455

## 2023-05-03 ENCOUNTER — Ambulatory Visit: Payer: Self-pay | Admitting: Physician Assistant

## 2023-05-03 DIAGNOSIS — Z91199 Patient's noncompliance with other medical treatment and regimen due to unspecified reason: Secondary | ICD-10-CM

## 2023-05-03 NOTE — Progress Notes (Signed)
Patient was not seen for appt d/t no call, no show, or late arrival >10 mins past appt time.    Debera Lat PA [facility] [address] [place], [place] [address] [phone] (phone) [phone] (fax) [facility]

## 2023-08-26 ENCOUNTER — Emergency Department: Payer: Self-pay

## 2023-08-26 ENCOUNTER — Encounter: Payer: Self-pay | Admitting: Emergency Medicine

## 2023-08-26 ENCOUNTER — Other Ambulatory Visit: Payer: Self-pay

## 2023-08-26 DIAGNOSIS — J449 Chronic obstructive pulmonary disease, unspecified: Secondary | ICD-10-CM | POA: Insufficient documentation

## 2023-08-26 DIAGNOSIS — J020 Streptococcal pharyngitis: Secondary | ICD-10-CM | POA: Insufficient documentation

## 2023-08-26 DIAGNOSIS — Z5321 Procedure and treatment not carried out due to patient leaving prior to being seen by health care provider: Secondary | ICD-10-CM | POA: Insufficient documentation

## 2023-08-26 DIAGNOSIS — R0789 Other chest pain: Secondary | ICD-10-CM | POA: Insufficient documentation

## 2023-08-26 DIAGNOSIS — R0602 Shortness of breath: Secondary | ICD-10-CM | POA: Insufficient documentation

## 2023-08-26 LAB — RESP PANEL BY RT-PCR (RSV, FLU A&B, COVID)  RVPGX2
Influenza A by PCR: NEGATIVE
Influenza B by PCR: NEGATIVE
Resp Syncytial Virus by PCR: NEGATIVE
SARS Coronavirus 2 by RT PCR: NEGATIVE

## 2023-08-26 LAB — BASIC METABOLIC PANEL
Anion gap: 13 (ref 5–15)
BUN: 12 mg/dL (ref 6–20)
CO2: 20 mmol/L — ABNORMAL LOW (ref 22–32)
Calcium: 8.5 mg/dL — ABNORMAL LOW (ref 8.9–10.3)
Chloride: 103 mmol/L (ref 98–111)
Creatinine, Ser: 0.73 mg/dL (ref 0.44–1.00)
GFR, Estimated: >60 mL/min (ref >60)
Glucose, Bld: 295 mg/dL — ABNORMAL HIGH (ref 70–99)
Potassium: 3 mmol/L — ABNORMAL LOW (ref 3.5–5.1)
Sodium: 136 mmol/L (ref 135–145)

## 2023-08-26 LAB — CBC
HCT: 43.4 % (ref 36.0–46.0)
Hemoglobin: 14.4 g/dL (ref 12.0–15.0)
MCH: 29.1 pg (ref 26.0–34.0)
MCHC: 33.2 g/dL (ref 30.0–36.0)
MCV: 87.9 fL (ref 80.0–100.0)
Platelets: 238 10*3/uL (ref 150–400)
RBC: 4.94 MIL/uL (ref 3.87–5.11)
RDW: 13.4 % (ref 11.5–15.5)
WBC: 18.7 10*3/uL — ABNORMAL HIGH (ref 4.0–10.5)
nRBC: 0 % (ref 0.0–0.2)

## 2023-08-26 LAB — GROUP A STREP BY PCR: Group A Strep by PCR: DETECTED — AB

## 2023-08-26 LAB — TROPONIN I (HIGH SENSITIVITY): Troponin I (High Sensitivity): 5 ng/L (ref <18)

## 2023-08-26 MED ORDER — IPRATROPIUM-ALBUTEROL 0.5-2.5 (3) MG/3ML IN SOLN
3.0000 mL | Freq: Once | RESPIRATORY_TRACT | Status: AC
  Administered 2023-08-26: 3 mL via RESPIRATORY_TRACT
  Filled 2023-08-26: qty 3

## 2023-08-26 NOTE — ED Triage Notes (Signed)
Patient here with shortness of breath that become increasingly worse over the last month but for the last 2 days has gotten worse. Patient w/ hx of COPD, asthma. C/o sore throat, coughing,chills, low grade temp in triage. C/o of chest tightness intermittently.

## 2023-08-27 ENCOUNTER — Emergency Department
Admission: EM | Admit: 2023-08-27 | Discharge: 2023-08-27 | Discharge status: Against Medical Advice | Payer: Self-pay | Attending: Emergency Medicine | Admitting: Emergency Medicine

## 2023-08-27 LAB — MAGNESIUM: Magnesium: 1.9 mg/dL (ref 1.7–2.4)

## 2023-08-27 MED ORDER — POTASSIUM CHLORIDE CRYS ER 20 MEQ PO TBCR: 40.0000 meq | EXTENDED_RELEASE_TABLET | Freq: Once | ORAL | Status: DC

## 2024-01-01 IMAGING — CR DG CHEST 2V
1 series · 2 of 2 positions shown · non-contrast
Comparison: 06/09/2020

CLINICAL DATA: Shortness of breath

EXAM:
CHEST - 2 VIEW

[Series 1: dg chest 2 view · 0.14mm/px · 2 of 2 slices shown]
[im 1/2]
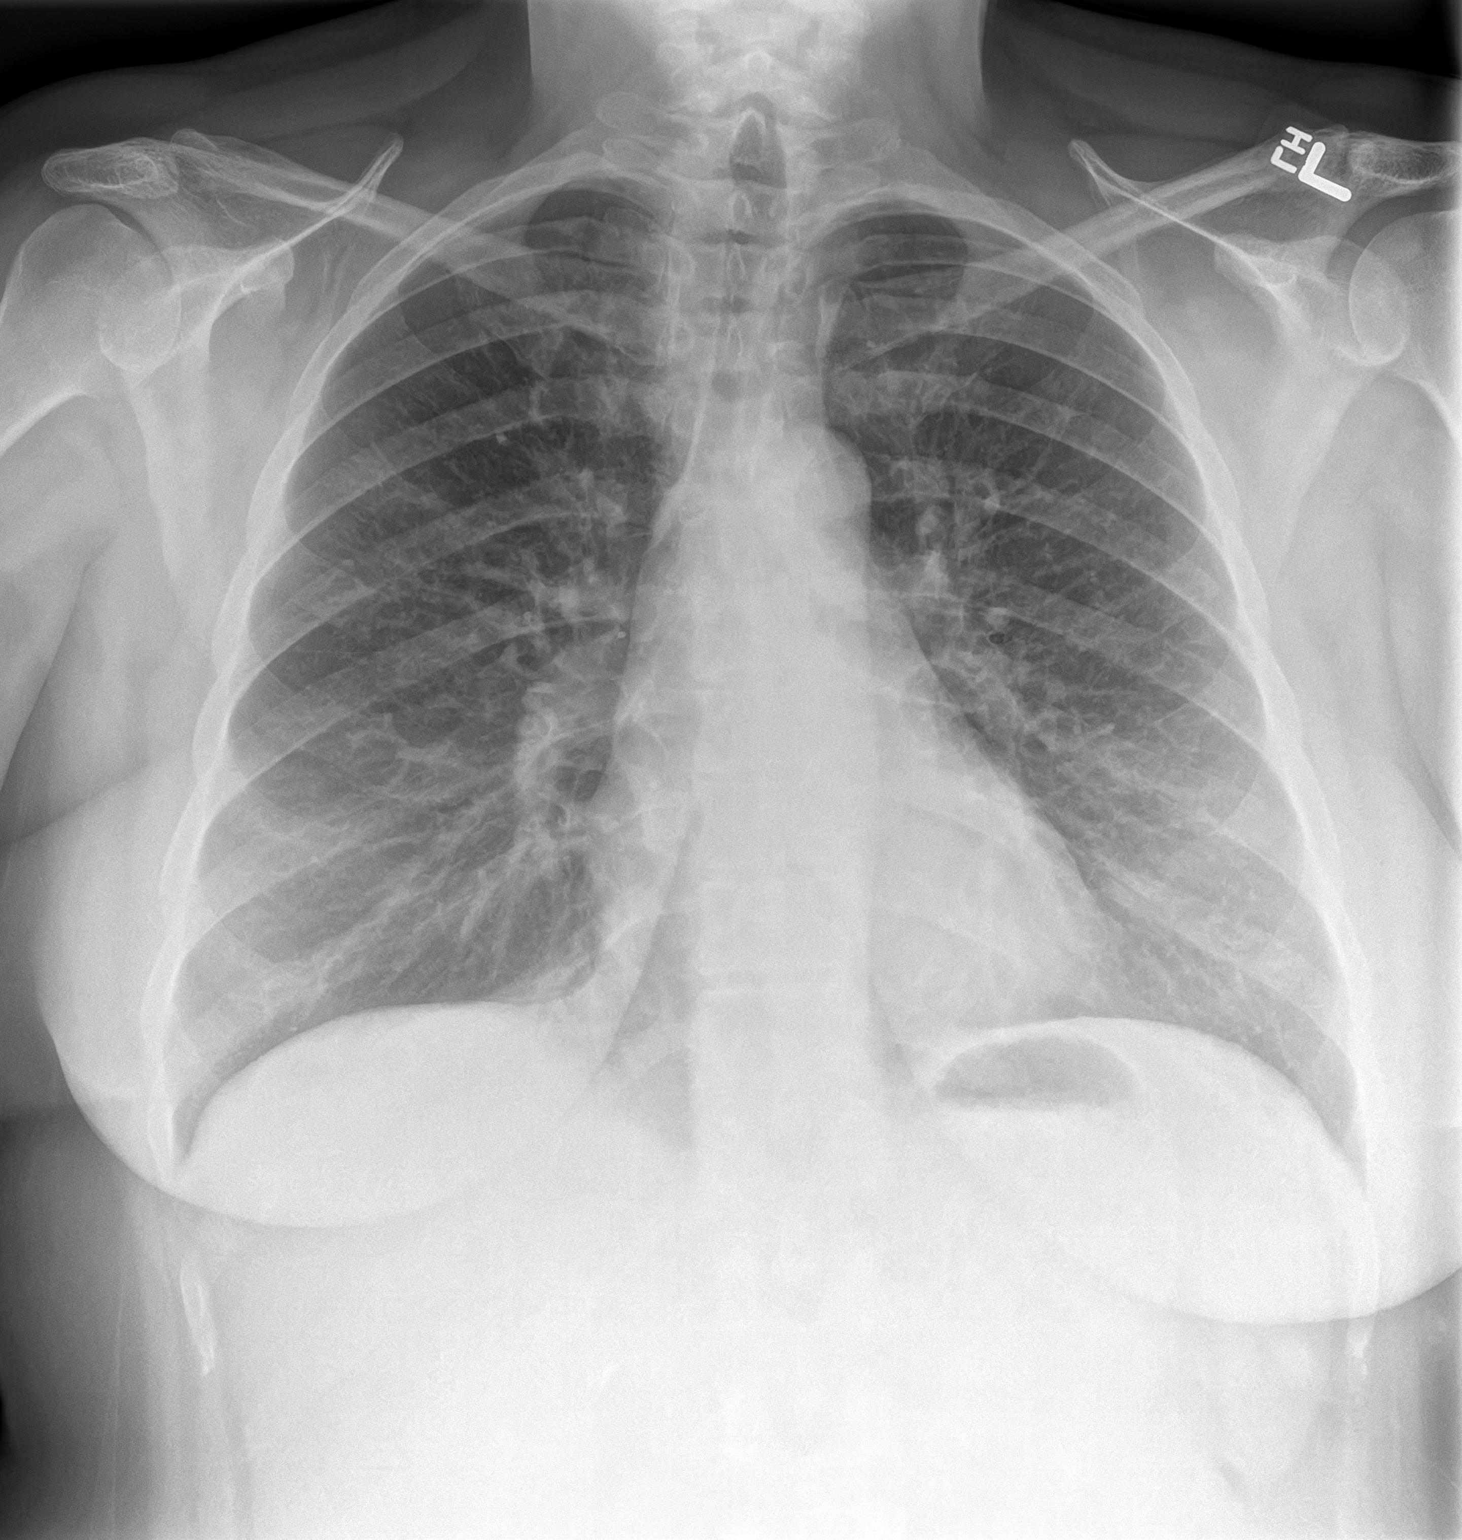
[im 2/2]
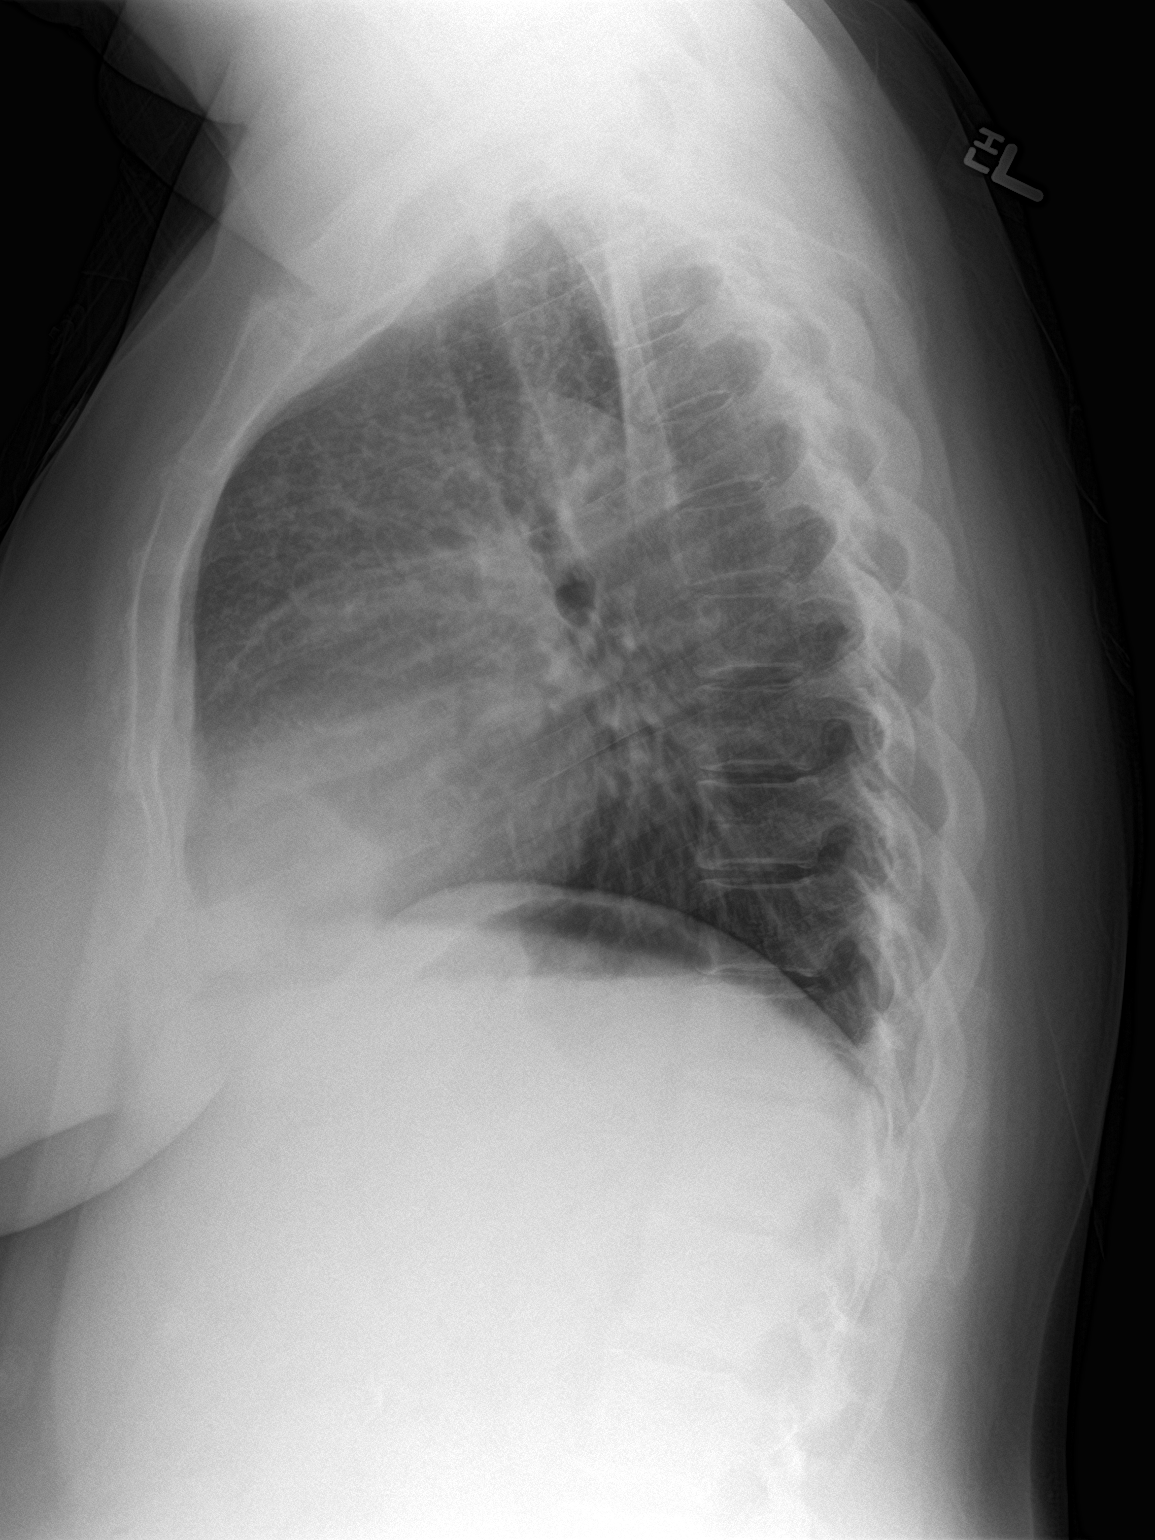

[2 of 2 positions shown; findings below may reference images not displayed]

FINDINGS: The heart size and mediastinal contours are within normal limits.
Both lungs are clear. The visualized skeletal structures are
unremarkable.
IMPRESSION: No active cardiopulmonary disease.

## 2024-01-01 IMAGING — CR DG LUMBAR SPINE COMPLETE 4+V
5 series · 5 of 5 positions shown · non-contrast
Comparison: None.

CLINICAL DATA: Low back pain following fall.  Initial encounter.

EXAM:
LUMBAR SPINE - COMPLETE 4+ VIEW

[l-spine ap]
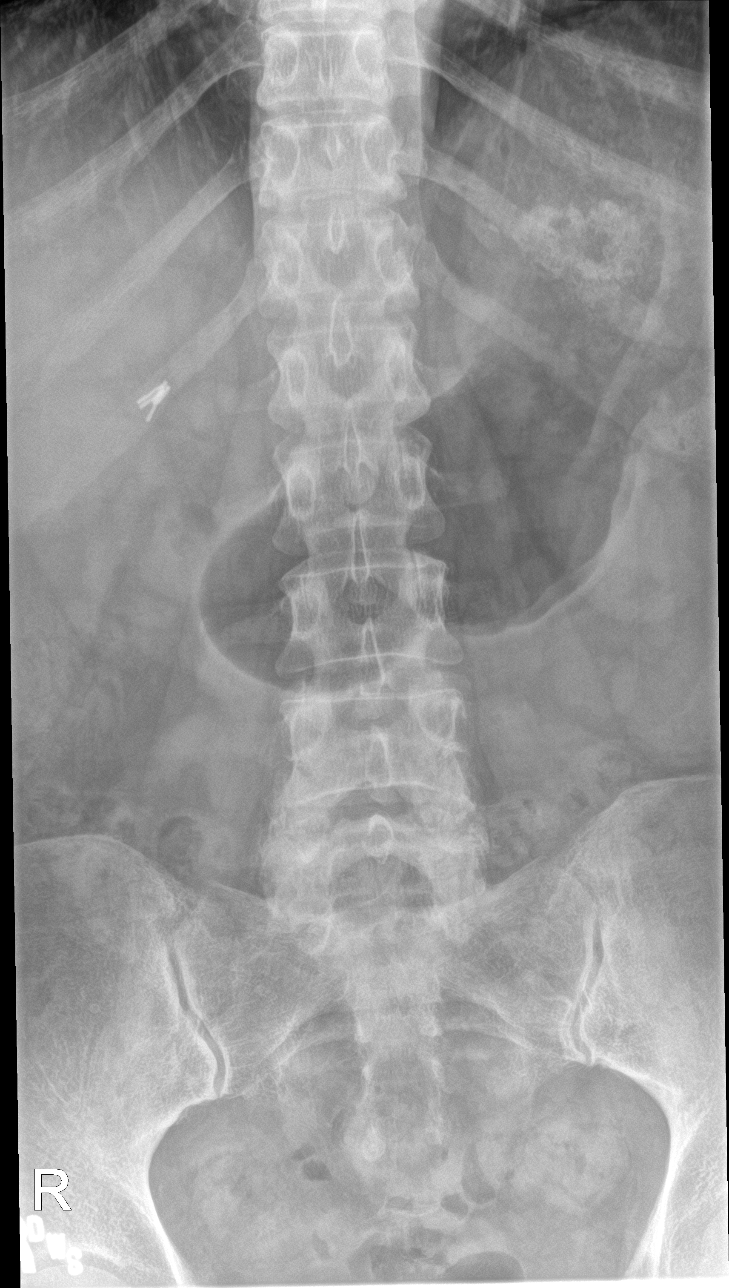

[l-spine obl (1 of 2)]
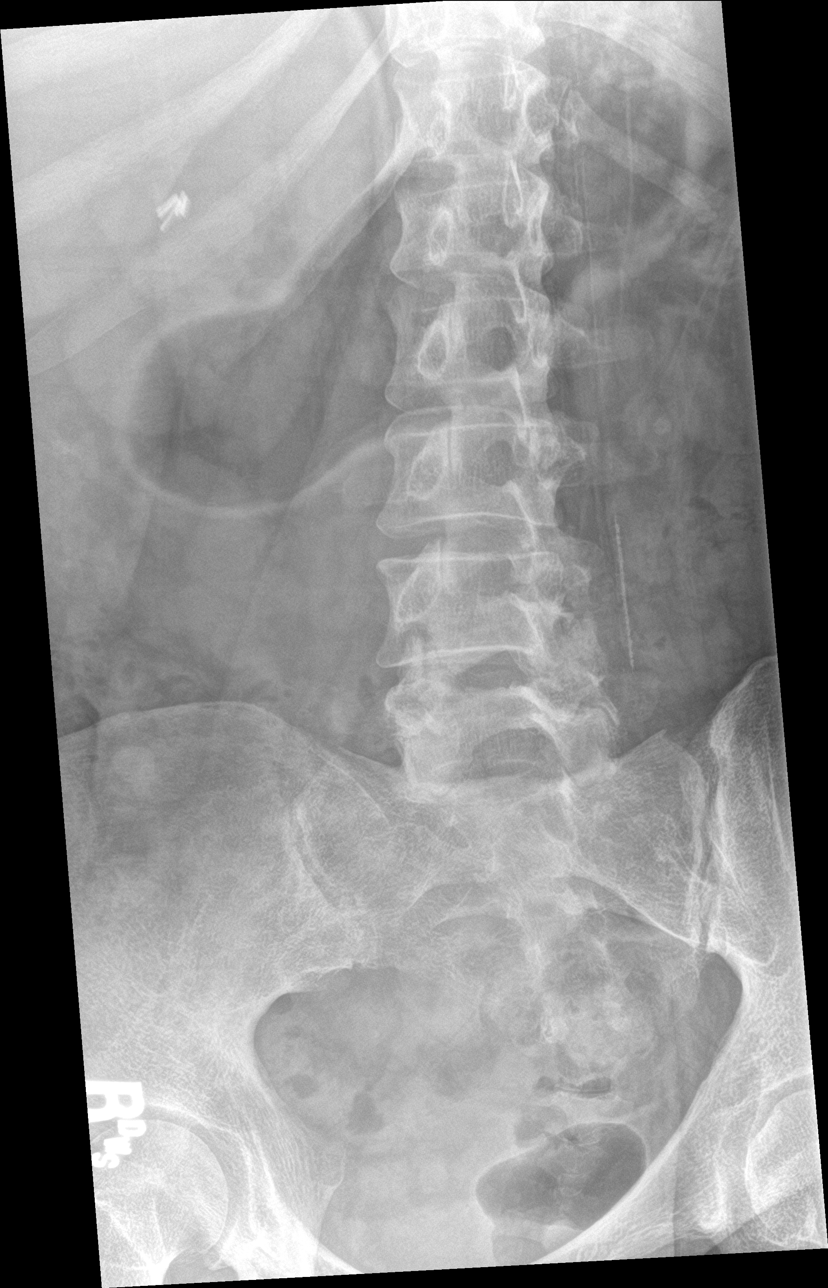

[l-spine obl (2 of 2)]
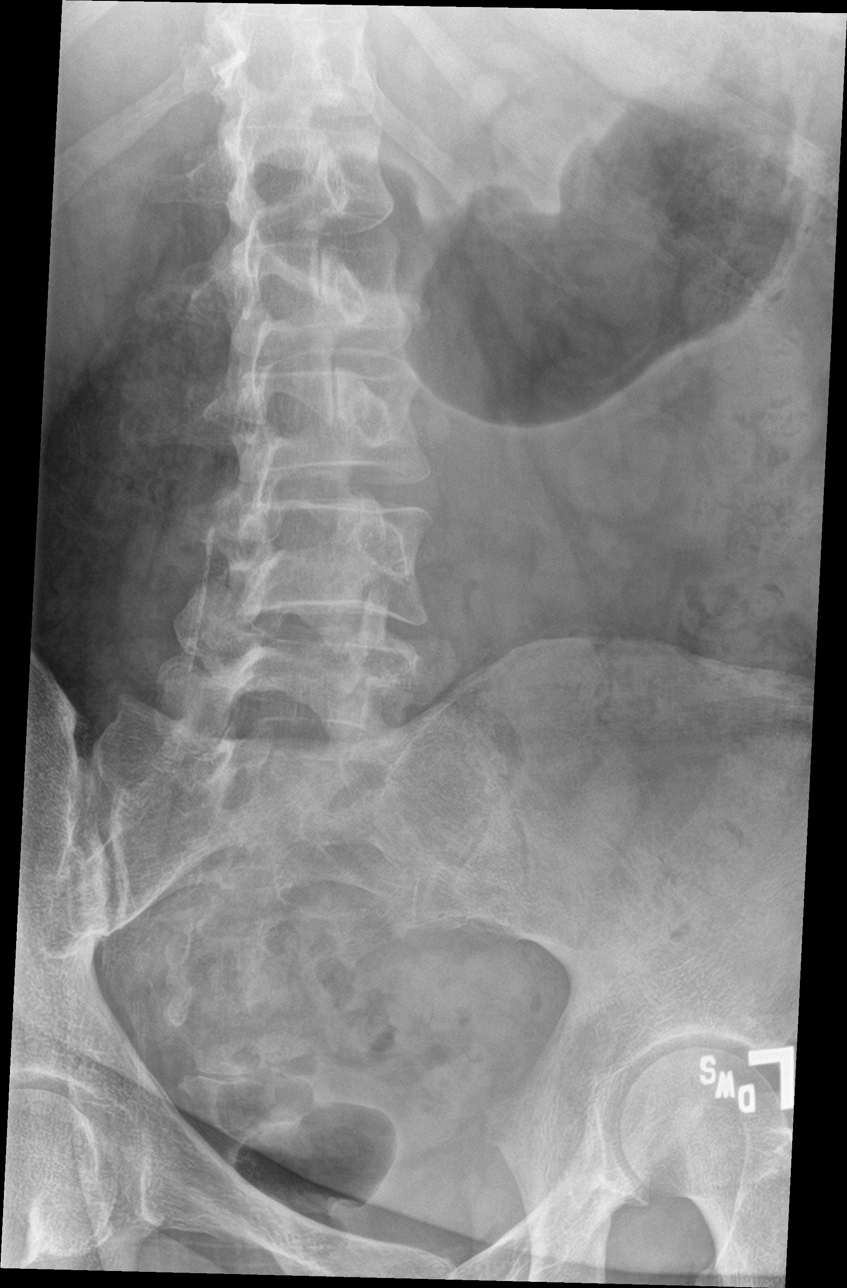

[l-spine lat]
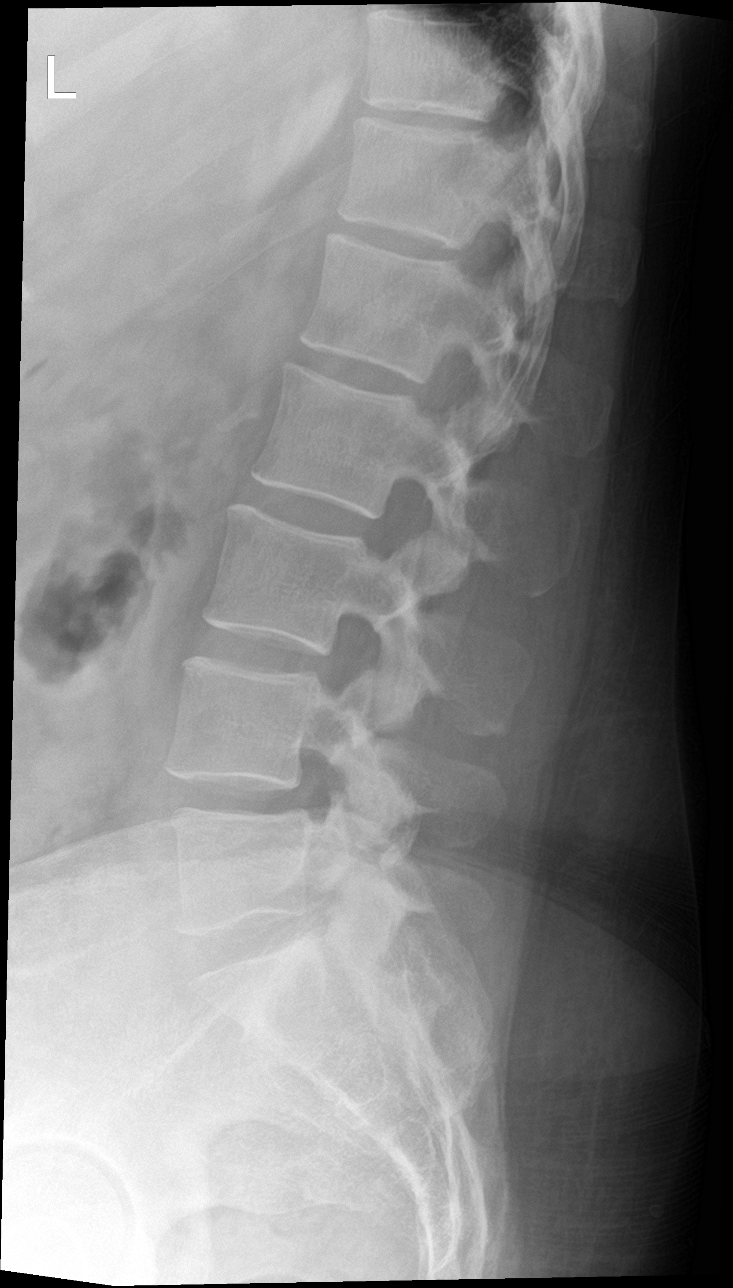

[l-spine spot]
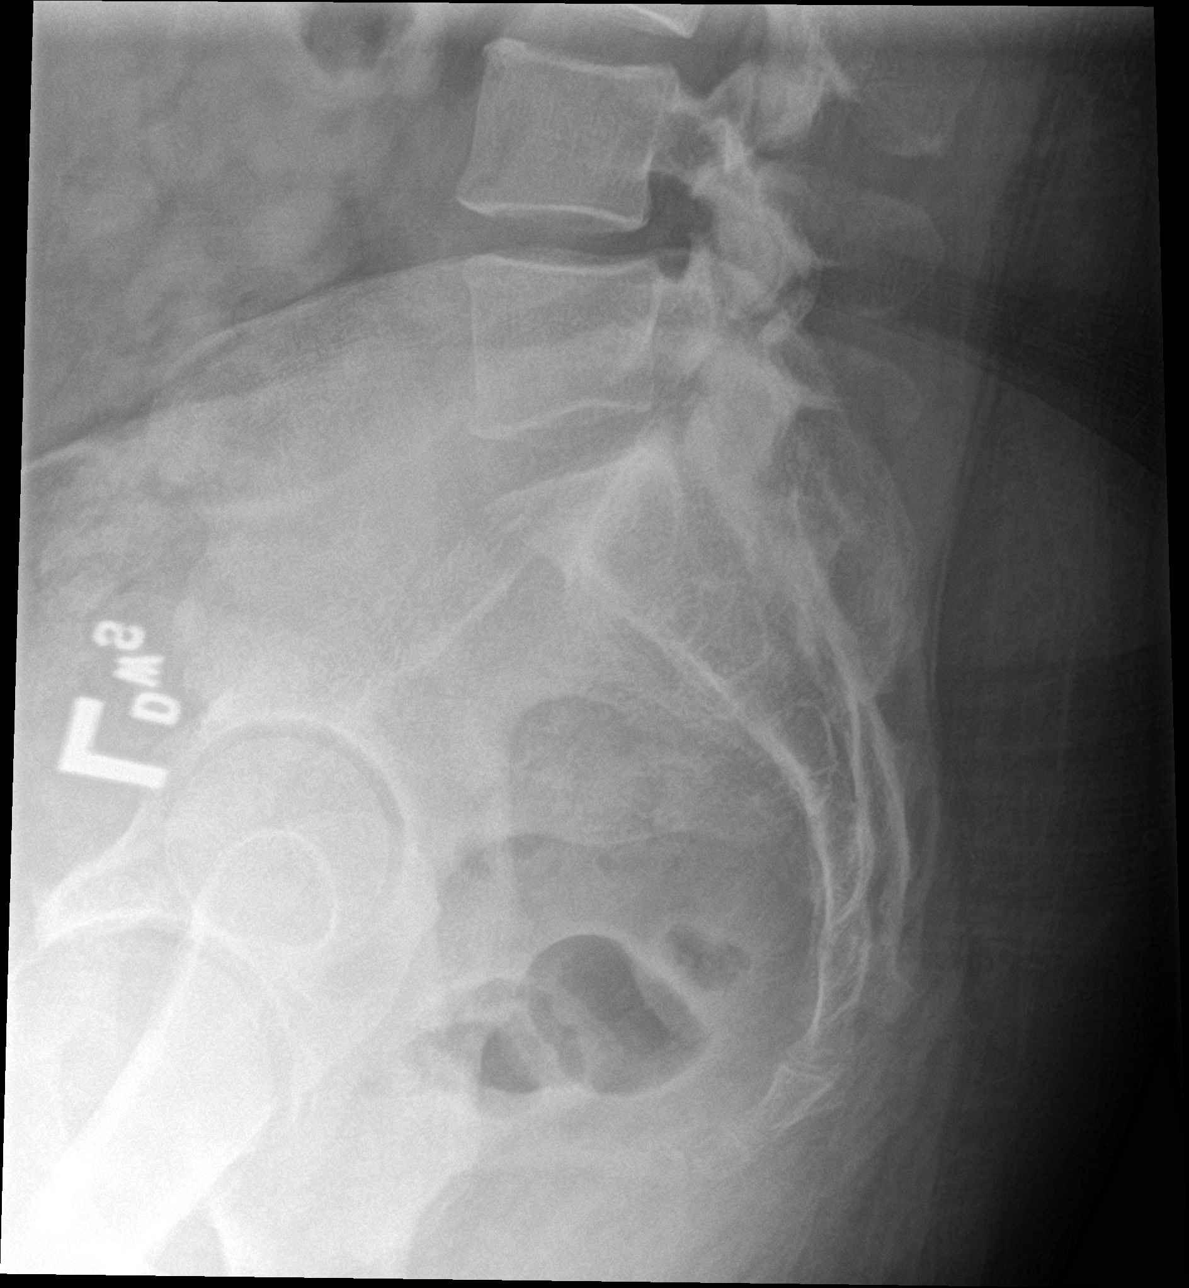

[5 of 5 positions shown; findings below may reference images not displayed]

FINDINGS: 2 mm anterolisthesis of L4 on L5 is identified.

No acute fracture is noted.

Disc spaces are maintained.

No focal bony lesions or spondylolysis noted.

Facet arthropathy in the LOWER lumbar spine noted.
IMPRESSION: 1. No evidence of acute lumbar spine fracture.
2. 2 mm anterolisthesis of L4 on L5 - likely degenerative.
3. Facet arthropathy in the LOWER lumbar spine.

## 2024-01-17 ENCOUNTER — Ambulatory Visit: Payer: Self-pay

## 2024-01-17 NOTE — Telephone Encounter (Signed)
 FYI Only or Action Required?: FYI only for provider.  Patient called for new patient appointment with pulmonary.  Called Nurse Triage reporting Shortness of Breath.  Symptoms began several years ago.  Interventions attempted: Rescue inhaler, Nebulizer treatments, and Increased fluids/rest.  Symptoms are: unchanged.  Triage Disposition: See PCP Within 2 Weeks-sent to scheduling for new patient appointment with pulmonary  Patient/caregiver understands and will follow disposition?: Yes  Copied from CRM [id number]. Topic: Clinical - Red Word Triage >> Jan 17, 2024  4:17 PM Lisa Sexton wrote: Red Word that prompted transfer to Nurse Triage: Non-established patient no referral looking for a new pulm provider; previously seen at [facility]. Pt has been having difficulty breathing and SOB since Nov. Patient stated due to insurance issues and issues keeping her job due to having asthma and COPD, she's been having inconsistencies with obtaining albuterol  and Advair inhalers. Pt would prefer a pulm provider at the [facility]. Reason for Disposition  [1] MILD longstanding difficulty breathing AND [2]  SAME as normal  Answer Assessment - Initial Assessment Questions 1. RESPIRATORY STATUS: Describe your breathing? (e.g., wheezing, shortness of breath, unable to speak, severe coughing)      Shortness of breath, wheezing, cough 2. ONSET: When did this breathing problem begin?      Patient states initial shortness of breath started many years ago 3. PATTERN Does the difficult breathing come and go, or has it been constant since it started?      Comes and goes 4. SEVERITY: How bad is your breathing? (e.g., mild, moderate, severe)    - MILD: No SOB at rest, mild SOB with walking, speaks normally in sentences, can lie down, no retractions, pulse < 100.    - MODERATE: SOB at rest, SOB with minimal exertion and prefers to sit, cannot lie down flat, speaks in phrases, mild retractions, audible  wheezing, pulse 100-120.    - SEVERE: Very SOB at rest, speaks in single words, struggling to breathe, sitting hunched forward, retractions, pulse > 120      Mild-Moderate 5. RECURRENT SYMPTOM: Have you had difficulty breathing before? If Yes, ask: When was the last time? and What happened that time?      Two months ago 6. CARDIAC HISTORY: Do you have any history of heart disease? (e.g., heart attack, angina, bypass surgery, angioplasty)      HTN 7. LUNG HISTORY: Do you have any history of lung disease?  (e.g., pulmonary embolus, asthma, emphysema)     Asthma, COPD 8. CAUSE: What do you think is causing the breathing problem?      Been inconsistent with inhalers due to insurance.   9. OTHER SYMPTOMS: Do you have any other symptoms? (e.g., dizziness, runny nose, cough, chest pain, fever)     cough 10. O2 SATURATION MONITOR:  Do you use an oxygen saturation monitor (pulse oximeter) at home? If Yes, ask: What is your reading (oxygen level) today? What is your usual oxygen saturation reading? (e.g., 95%)       N/A 12. TRAVEL: Have you traveled out of the country in the last month? (e.g., travel history, exposures)       No  Patient calling to make new patient appointment with pulmonology. Patient given information in regards to UC/ED precautions. Patient is transferred to scheduling.  Protocols used: Breathing Difficulty-A-AH

## 2024-02-16 NOTE — Progress Notes (Signed)
 ------------------------------------------------------------------------------- Attestation signed by Violette Morene Cadet, MD at 02/17/24 1000 Attestation   I saw and evaluated the patient, participating in key portions of the E/M service. I personally reviewed and agree with the plan as documented in the resident's note.   Morene JINNY Violette, MD  -------------------------------------------------------------------------------    [facility] Pulmonary Diseases and Critical Care Medicine Pulmonary Clinic - Initial Visit  Referring Physician :  Rosalita Lynwood Abu PCP:     Marvis Comer Garre, MD Reason for Consult:   COPD/asthma management  ASSESSMENT and PLAN   Lisa Sexton is a 53 y.o. female current 0.5 ppd smoker (30 py hx) with PMHx HS, Obesity, pre-DM who presents for management of GOLD2E asthma-copd overlap syndrome. Previously followed in pulmonary clinic with moderate obstructive impairment on 2019 PFTs w/ BD response. PFTs in 2023 show similar values to those of 2019 (pre-BD). She has not previously had evidence of TH2 high asthma component. Today, she presents to re-establish care after having her COPD-Asthma managed by her PCP for 2 years and PFTs appear stable at this visit. She reports that she has stopped smoking and I congratulated her on this. She had gone several months without her Trelegy due to a gap in employment effecting insurance coverage, and though she has resumed her inhalers, she does not feel that she has returned to her previous baseline. I recommended pulmonary rehab and she was amenable as she is interested in returning to work. She adds that frequent exacerbations have been a past problem for her and have effected her employment in the past. Presently, she does not appear to be in active exacerbation given absence of wheezing and change in sputum color though she would benefit from decreasing frequency of exacerbation. As such, we will plan to start azithromycin   in an effort to decrease frequency of exacerbations and consider further escalation in therapy pending response.  Recommendations: - Continue triple therapy with Trelegy Ellipta (PAP vs. Manufacturer assistance) - Asthma triggers: continue PPI & behavioral modifications, sleep study to assess for OSA - Commended patient on cessation of tobacco use. Encouraged continued abstinence and continuing nicotine patches if helping - Referral provided for pulmonary rehab - Start Azithromycin  250mg  MWF given frequent exacerbations. Will repeat ECG in clinic to reassess Qtc interval - Lung Cancer Screening CT - Nystatin Oral suspension for oral thrush.  The patient meets eligibility criteria including age, smoking history (20+ pack years), if a former smoker, quit in the last 15 years, and absence of signs or symptoms of lung cancer.   We conducted a shared decision-making process using a decision aid. We reviewed benefits and harms of screening, including false positives and potential need for additional diagnostic testing, the possibility of over diagnosis, and total radiation exposure.    We discussed the importance of adhering to annual LDCT screening. We also discussed the impact of comorbidities on the patient's ability or willingness to undergo diagnostic procedure(s) and treatment.   We discussed the importance of achieving/maintaining abstinence from cigarette smoking.   Based on our discussion, we have decided to begin annual lung cancer screening starting now   1. Moderate persistent asthma without complication (HHS-HCC)   2. Chronic obstructive pulmonary disease, unspecified COPD type      3. Obesity (BMI 30-39.9)   4. History of tobacco use   5. Oral thrush   6. Medication monitoring encounter     Plan of care was discussed with the patient who acknowledged understanding and is in agreement.  Patient  will Return in about 3 months (around 05/18/2024). or sooner if needed.  Ms.  Winne was seen, examined and discussed with Dr. Rosina Sauers who agrees with the assessment and plan above.   RR:Ylazmu Lynwood Abu, Marvis, Comer Garre, MD  Mylene Antonio Pulmonary-Critical Care Fellow   Indication for Test: Inhaler therapy management for uncontrolled asthma Date of Test: 05/13/2022 FeNO: <5 ppb  Current Smoker: no Current URI: no Daily Oral Corticosteroids: no Peripheral Absolute Eosinophil Count: <150 cells/L Current Inhaler therapy: Triple Therapy with Trelegy   ** Table from 2023 GINA Guideline  Management Decision: Optimize inhaled therapy, add azithromycin  given frequent exacerbation history & consider addition of Tezspire  HISTORY:    History of Present Illness: Ms. Cantu is a 53 y.o. female current 0.5 pack/day smoker (~30 pack year) with PMHx obesity, hidradinitis suppurativa, prediabetes who is seen in consultation at the request of Abu Rosalita Lynwood, MD for comprehensive evaluation of asthma/copd. She was previously followed in the [facility] pulmonary clinic, last seen in 2021 with Dr. Violette. She has had multiple exacerbations this past year requiring prednisone  tapers. She quit smoking briefly in Aug/Sept, but unfortunately resumed again this fall. She is not interested in adjunctive medications for smoking cessation at this time (vivid dreams on chantix previously) and will reach out if she would like referral to [facility] smoking cessation program again. She has quit multiple times in the past. She is currently on symbicort + spiriva respimat for her lung disease. She is also taking doxycycline chronically for management of her hidradinitis suppurativa, prescribed by Derm. Today, she feels symptoms have worsened again with increased cough, wheeze & chest tightness. She is waking up frequently at night with cough. She does note snoring & partner has seen episodes of stopping breathing during sleep. She has no prior PSG. She has co-morbid allergies, sinus  disease & GERD.  Interval Hx 02/16/2024: Patient reports that she was receiving most of her care with her PCP and received inhaler prescriptions from her PCP in the interim since previous follow-up. However, a gap in her employment had caused her to experience a lapse in health insurance coverage and she subsequently went months without Trelegy earlier this year. She has re-established health insurance and has since resumed Trelegy. Generally, she reports that she developed worsening symptoms in December though these are better since resuming Trelegy. At baseline, she states she feels out of breath after about 9 steps up stairs. She states that she does not feel like she is back to her previous baseline. Cough is improved. Sputum is clear. Reports that she has not smoked cigarettes since January though her husband frequently smoked. Using nicotine patches have helped. Using Albuterol  4-5 times per day, which provides some relief. 4 ER visits in the past year. No hospitalizations in the past, no changes to the color of her sputum, no fever, no significant rhinorrhea or nasal congestion  From prior Pulm record: 08/2010: RAST testing negative, IgE 52.2, eos normal   Attacks in the past: PO steroids? Jan 2023, April 2023, May 2023, Aug 2023, Sept 2023, 09/2023, 08/24/2023 Hospitalizations? None.   Symptoms: Daytime Sx: cough productive of frothy white sputum, wheezing Nocturnal Sx: Most nights waking up with cough/wheeze Exercise limitation: Can go up 1 flight before needing to stop & catch breath Rescue inhaler use: multiple times per day, also has nebulizer - but using only if sick  Triggers: Allergies: Seasonal allergies, zyrtec daily Rhinitis: sinus congestion recently, some days worse than others GERD:  prilosec daily, not using behavioral modification  Host co morbidities: OSA: Snores, worse on her back, sometimes stopping breathing according to her partner, feels fatigued.  Obesity: Y,  weight is stable.   Medications for asthma: Controller: Symbicort 2 puffs bid, spiriva --> Trelegy Rescue: albuterol  + neb Inhaler Technique: good, uses w/ spacer Affordability: Good - has PAP  Action plan:  Immunizations? Due for flu shot today  Symptoms (mMRC/CAT): MMRC 2  Pulmonary Rehab:  The patient declines pulmonary rehab referral at this time, will discuss again at next visit.  Lung Cancer screening: Due this spring at age 49* - USPSTF recommends annual screening for lung cancer with low-dose computed tomography in adults ages 40 to 67 years who have a 20 pack-year smoking history and currently smoke or have quit within the past 15 years. Screening should be discontinued once a person has not smoked for 15 years or develops a health problem that substantially limits life expectancy or the ability or willingness to have curative lung surgery Smoking: Started smoking age 70, estimates 1 ppd. Has quit for months at a time previously.  Goals of care: full scope of care (not readdressed today)  Vaccinations:  Immunization History  Administered Date(s) Administered  . COVID-19 VAC,BIVALENT,MODERNA(BLUE CAP) 04/04/2021  . COVID-19 VACCINE,MRNA(MODERNA)(PF) 02/29/2020, 03/28/2020  . INFLUENZA TIV (TRI) PF (IM)(HISTORICAL) 04/22/2009, 05/27/2011  . Influenza Vaccine Quad(IM)6 MO-Adult(PF) 04/11/2014, 05/02/2015, 03/24/2018, 03/28/2020, 05/13/2022  . MMR 02/06/2022  . Novel Influenza-H1N1-09 07/24/2008  . PNEUMOCOCCAL POLYSACCHARIDE 23-VALENT 05/02/2015  . Pneumococcal Conjugate 20-valent 02/02/2022  . SHINGRIX-ZOSTER VACCINE (HZV),RECOMBINANT,ADJUVANTED(IM) 01/26/2024  . TdaP 07/24/2008, 02/01/2018  . Tuberculin Skin Test;unspecified Formulation 12/20/2019    Endobronchial valve candidate (criteria listed below) No. Reason: BMI > 35. Must meet all criteria  DEMOGRAPHICS AND GENERAL SCREENING  Age 72 to 50 years  Nonsmoker status for 4 months  BMI < 35 kg/m2  Clinically  stable on Prednisone  <=20 mg/day  <2 COPD exacerbations with hospitalization in prior year  mMRC 2+  Completed pulm rehab program (if feasible)  Not on antiplatelet/anticoagulation therapy (except 81mg  Asa)    PULMONARY FUNCTION TESTING  Post-BD FEV1/FVC < LLN  Post-BD FEV1 15-45% predicted  TLC > 100% predicted  RV > 175% predicted  DLCO > 20% predicted  between 100-500 m (250-440-9811 ft) for Heterogenous Disease or 150-500 m for homogenous disease   OSH records and referral documentation reviewed.   Review of Systems: A comprehensive review of systems was completed and negative except as noted in HPI.  PMHx: pre-diabetic, obesity, panic attacks  PSHx: No prior surgeries on chest or abdomen.   FHx: Mother also has asthma.  Social  EtoH - socially Illicits - denies  Occupational None.  Exposures: None of the following - asbestos - silica  - ceramics  - metals  - dusts  - molds  - hot tub/sauna/flooding  Pets: unknown.  Relevant Medications:   Allergies: Reviewed.  Other History: The past medical history, surgical history, social history, family history, medications and allergies were personally reviewed and updated in the patient's electronic medical record. Pertinent items are noted above.       PHYSICAL EXAM:    Physical Exam: Vitals:   02/16/24 1232  BP: 147/86  BP Site: L Arm  BP Position: Sitting  BP Cuff Size: Large  Pulse: 86  Temp: 36.4 C (97.5 F)  TempSrc: Temporal  SpO2: 97%  Weight: 93.9 kg (207 lb)  Height: 157 cm (5' 1.81)    Body mass index  is 38.09 kg/m.  General: WD, Obese, Non-toxic. NAD.  Eyes: Pupils equal. Anicteric sclera.  ENT:  Nasal mucosa clear. No polyps seen. No crusting. Lymph: No cervical or supraclavicular lymphadenopathy. Respiratory: CTAB though mildly diminished breath sounds diffusely, no wheezes, no rales, no rhonchi Cardiovascular: RRR, +s1/s2, Normal P2, no RV heave, No MRG, No  edema Musculoskeletal/extremities: Normal muscle tone and strength. No cyanosis. No clubbing.  Skin: No skin rashes on clothed exam Neuro: Alert and oriented x 3. CN grossly intact. No focal neurological deficits Psych: mood good, affect congruent    LABORATORY and RADIOLOGY DATA:   Pulmonary Function Tests/Interpretation:    Pulmonary Function Test Results  Pleth: not done yet. : not done yet.  Pertinent Laboratory Data: Eos 0.1, IgE 66, CEP negative.    Comprehensive Environmental Panel:   Ref Range & Units 05/13/22 1150    Cocklebur IgE <0.35 kUA/L <0.35   Cat dander IgE <0.35 kUA/L <0.35   Cottonwood (White Poplar) Tree IgE <0.35 kUA/L <0.35   Dog Dander IgE <0.35 kUA/L <0.35   D. farinae IgE <0.35 kUA/L <0.35   D. pteronyssinus IgE <0.35 kUA/L <0.35   Bahia Grass IgE <0.35 kUA/L <0.35   Johnson Grass IgE <0.35 kUA/L <0.35   Timothy Grass IgE <0.35 kUA/L <0.35   Alternaria alternata IgE <0.35 kUA/L <0.35   Candida Albicans IgE <0.35 kUA/L <0.35   Cladosporium Herbarum IgE <0.35 kUA/L <0.35   Epicoccum purpurascens IgE <0.35 kUA/L <0.35   Fusarium Proliferatum IgE <0.35 kUA/L <0.35   Aspergillus fumigatus IgE <0.35 kUA/L <0.35   Mucor Racemosus IgE <0.35 kUA/L <0.35   Aspergillus luxembourg IgE <0.35 kUA/L <0.35   Mouse IgE <0.35 kUA/L <0.35   P chrysogenum (P notatum) IgE <0.35 kUA/L <0.35   Rhizopus nigrican IgE <0.35 kUA/L <0.35   Trichophyton rubrum IgE <0.35 kUA/L <0.35   Setomelanomma rostrata (H. halodes) IgE <0.35 kUA/L <0.35   Mugwort IgE <0.35 kUA/L <0.35   Pigweed, Common IgE <0.35 kUA/L <0.35   English Plantain IgE <0.35 kUA/L <0.35   Giant Ragweed IgE <0.35 kUA/L <0.35   German Cockroach IgE <0.35 kUA/L <0.35   Sheep Sorrel IgE <0.35 kUA/L <0.35   Ragweed, short (common) IgE <0.35 kUA/L <0.35   White Ash Tree IgE <0.35 kUA/L <0.35   Beech (American) tree IgE <0.35 kUA/L <0.35   Birch (Common Silver) tree IgE <0.35  kUA/L <0.35   Box Elder Tree IgE <0.35 kUA/L <0.35   Elm Tree IgE <0.35 kUA/L <0.35   Maple Leaf Sycamore IgE <0.35 kUA/L <0.35   White Oak Tree IgE <0.35 kUA/L <0.35   Pecan Hickory IgE <0.35 kUA/L <0.35   Walnut Tree IgE <0.35 kUA/L <0.35   [place] Grass IgE <0.35 kUA/L <0.35   Goosefoot ([place]) IgE <0.35 kUA/L <0.35   Willow tree IgE <0.35 kUA/L <0.35    Latest Reference Range & Units 05/13/22 11:50  IgE, Total 2-214 IU/mL IU/mL 65.6    Pertinent Imaging Data:  CXR 03/2022 w/ prominent airways, no cross-sectional chest imaging.   Images were personally reviewed with attending.   10/10/23 CXR:  Impression    Interval increase in prominence of bronchovascular markings bilaterally; may represent underlying bronchitis versus reactive airways disease. No consolidation.   Pertinent Cardiology Data:  8/6 ECG: NSR, rate of 85, normal axis, normal intervals, Qtc 464, no STE or depressions

## 2024-02-18 ENCOUNTER — Ambulatory Visit: Admitting: Pulmonary Disease

## 2024-03-03 ENCOUNTER — Ambulatory Visit: Admitting: Pulmonary Disease
